# Patient Record
Sex: Female | Born: 1970
Health system: Southern US, Community
[De-identification: ages and names within clinical notes are randomized; demographics above are authoritative.]

## PROBLEM LIST (undated history)

## (undated) DIAGNOSIS — D251 Intramural leiomyoma of uterus: Secondary | ICD-10-CM

## (undated) DIAGNOSIS — N920 Excessive and frequent menstruation with regular cycle: Secondary | ICD-10-CM

## (undated) DIAGNOSIS — F32A Depression, unspecified: Secondary | ICD-10-CM

## (undated) DIAGNOSIS — D219 Benign neoplasm of connective and other soft tissue, unspecified: Secondary | ICD-10-CM

## (undated) HISTORY — DX: Benign neoplasm of connective and other soft tissue, unspecified: D21.9

## (undated) HISTORY — DX: Intramural leiomyoma of uterus: D25.1

## (undated) HISTORY — DX: Excessive and frequent menstruation with regular cycle: N92.0

## (undated) HISTORY — PX: ABDOMINAL HYSTERECTOMY: SHX81

---

## 2007-08-19 ENCOUNTER — Ambulatory Visit: Payer: Self-pay | Admitting: Physician Assistant

## 2014-04-13 ENCOUNTER — Ambulatory Visit: Payer: Self-pay | Admitting: Nurse Practitioner

## 2019-12-22 DIAGNOSIS — F331 Major depressive disorder, recurrent, moderate: Secondary | ICD-10-CM | POA: Diagnosis not present

## 2019-12-27 DIAGNOSIS — F331 Major depressive disorder, recurrent, moderate: Secondary | ICD-10-CM | POA: Diagnosis not present

## 2020-01-13 ENCOUNTER — Ambulatory Visit: Payer: Self-pay

## 2020-01-13 ENCOUNTER — Other Ambulatory Visit: Payer: Self-pay

## 2020-01-13 ENCOUNTER — Telehealth: Payer: Self-pay | Admitting: Nurse Practitioner

## 2020-01-13 DIAGNOSIS — Z20822 Contact with and (suspected) exposure to covid-19: Secondary | ICD-10-CM

## 2020-01-13 LAB — POC COVID19 BINAXNOW: SARS Coronavirus 2 Ag: NEGATIVE

## 2020-01-13 NOTE — Progress Notes (Signed)
   Subjective:    Patient ID: Jessica Woods, female    DOB: [birth year], 48 y.o.   MRN: [id number]  HPI HA and runny nose for the past 24 hours, had vaccine in March/April for COVID. Denies any other symptoms and is improving today. Denies close contacts. Here today for COVID testing.   Wednesday she was sitting in a room that was very cold, that evening she started to have a slight runny nose. She is very diligent about masking.   Review of Systems  Constitutional: Negative for fever.  HENT: Positive for congestion.   Respiratory: Negative for cough.   Neurological: Positive for headaches.       Objective:   Physical Exam        Assessment & Plan:  Telehphone visit after parking lot Rapid COVID with RN. Rapid COVID negative, risk level low, no medical indication for PCR, offered patient and she declined.      RTC as needed no further recommendations at this time.

## 2020-03-07 ENCOUNTER — Ambulatory Visit (INDEPENDENT_AMBULATORY_CARE_PROVIDER_SITE_OTHER): Payer: BC Managed Care – PPO | Admitting: Obstetrics and Gynecology

## 2020-03-07 ENCOUNTER — Other Ambulatory Visit: Payer: Self-pay

## 2020-03-07 ENCOUNTER — Encounter: Payer: Self-pay | Admitting: Obstetrics and Gynecology

## 2020-03-07 ENCOUNTER — Ambulatory Visit: Payer: BC Managed Care – PPO

## 2020-03-07 ENCOUNTER — Other Ambulatory Visit (HOSPITAL_COMMUNITY)
Admission: RE | Admit: 2020-03-07 | Discharge: 2020-03-07 | Disposition: A | Discharge status: Home / Self Care | Payer: BC Managed Care – PPO | Source: Ambulatory Visit | Attending: Obstetrics and Gynecology | Admitting: Obstetrics and Gynecology

## 2020-03-07 VITALS — BP 118/72 | Ht 61.0 in | Wt 187.4 lb

## 2020-03-07 DIAGNOSIS — Z01419 Encounter for gynecological examination (general) (routine) without abnormal findings: Secondary | ICD-10-CM | POA: Diagnosis not present

## 2020-03-07 DIAGNOSIS — Z1231 Encounter for screening mammogram for malignant neoplasm of breast: Secondary | ICD-10-CM | POA: Diagnosis not present

## 2020-03-07 DIAGNOSIS — Z0000 Encounter for general adult medical examination without abnormal findings: Secondary | ICD-10-CM | POA: Diagnosis not present

## 2020-03-07 DIAGNOSIS — Z23 Encounter for immunization: Secondary | ICD-10-CM | POA: Diagnosis not present

## 2020-03-07 DIAGNOSIS — Z124 Encounter for screening for malignant neoplasm of cervix: Secondary | ICD-10-CM | POA: Insufficient documentation

## 2020-03-07 DIAGNOSIS — N760 Acute vaginitis: Secondary | ICD-10-CM | POA: Diagnosis not present

## 2020-03-07 DIAGNOSIS — N3946 Mixed incontinence: Secondary | ICD-10-CM

## 2020-03-07 DIAGNOSIS — N920 Excessive and frequent menstruation with regular cycle: Secondary | ICD-10-CM

## 2020-03-07 DIAGNOSIS — D259 Leiomyoma of uterus, unspecified: Secondary | ICD-10-CM

## 2020-03-07 DIAGNOSIS — N889 Noninflammatory disorder of cervix uteri, unspecified: Secondary | ICD-10-CM | POA: Diagnosis not present

## 2020-03-07 DIAGNOSIS — Z13220 Encounter for screening for lipoid disorders: Secondary | ICD-10-CM

## 2020-03-07 DIAGNOSIS — Z131 Encounter for screening for diabetes mellitus: Secondary | ICD-10-CM

## 2020-03-07 DIAGNOSIS — Z1211 Encounter for screening for malignant neoplasm of colon: Secondary | ICD-10-CM | POA: Diagnosis not present

## 2020-03-07 DIAGNOSIS — B9689 Other specified bacterial agents as the cause of diseases classified elsewhere: Secondary | ICD-10-CM | POA: Diagnosis not present

## 2020-03-07 DIAGNOSIS — Z1329 Encounter for screening for other suspected endocrine disorder: Secondary | ICD-10-CM

## 2020-03-07 DIAGNOSIS — N870 Mild cervical dysplasia: Secondary | ICD-10-CM | POA: Diagnosis not present

## 2020-03-07 MED ORDER — METRONIDAZOLE 500 MG PO TABS: 500.0000 mg | ORAL_TABLET | Freq: Two times a day (BID) | ORAL | 0 refills | Status: AC

## 2020-03-07 NOTE — Progress Notes (Signed)
Pt states she's here for a Annual Exam.

## 2020-03-07 NOTE — Patient Instructions (Addendum)
[employer]stitute of [place] for Calcium and Vitamin D  Age (yr) Calcium Recommended Dietary Allowance (mg/day) Vitamin D Recommended Dietary Allowance (international units/day)  9-18 1,300 600  19-50 1,000 600  51-70 1,200 600  71 and older 1,200 800  Data from Institute of Medicine. Dietary reference intakes: calcium, vitamin D. [place], [place]: [employer]; 2011.    Exercising to Stay Healthy To become healthy and stay healthy, it is recommended that you do moderate-intensity and vigorous-intensity exercise. You can tell that you are exercising at a moderate intensity if your heart starts beating faster and you start breathing faster but can still hold a conversation. You can tell that you are exercising at a vigorous intensity if you are breathing much harder and faster and cannot hold a conversation while exercising. Exercising regularly is important. It has many health benefits, such as:  Improving overall fitness, flexibility, and endurance.  Increasing bone density.  Helping with weight control.  Decreasing body fat.  Increasing muscle strength.  Reducing stress and tension.  Improving overall health. How often should I exercise? Choose an activity that you enjoy, and set realistic goals. Your health care provider can help you make an activity plan that works for you. Exercise regularly as told by your health care provider. This may include:  Doing strength training two times a week, such as: ? Lifting weights. ? Using resistance bands. ? Push-ups. ? Sit-ups. ? Yoga.  Doing a certain intensity of exercise for a given amount of time. Choose from these options: ? A total of 150 minutes of moderate-intensity exercise every week. ? A total of 75 minutes of vigorous-intensity exercise every week. ? A mix of moderate-intensity and vigorous-intensity exercise every week. Children, pregnant women, people who have not exercised  regularly, people who are overweight, and older adults may need to talk with a health care provider about what activities are safe to do. If you have a medical condition, be sure to talk with your health care provider before you start a new exercise program. What are some exercise ideas? Moderate-intensity exercise ideas include:  Walking 1 mile (1.6 km) in about 15 minutes.  Biking.  Hiking.  Golfing.  Dancing.  Water aerobics. Vigorous-intensity exercise ideas include:  Walking 4.5 miles (7.2 km) or more in about 1 hour.  Jogging or running 5 miles (8 km) in about 1 hour.  Biking 10 miles (16.1 km) or more in about 1 hour.  Lap swimming.  Roller-skating or in-line skating.  Cross-country skiing.  Vigorous competitive sports, such as football, basketball, and soccer.  Jumping rope.  Aerobic dancing. What are some everyday activities that can help me to get exercise?  [place] work, such as: ? Pushing a Conservation officer, nature. ? Raking and bagging leaves.  Washing your car.  Pushing a stroller.  Shoveling snow.  Gardening.  Washing windows or floors. How can I be more active in my day-to-day activities?  Use stairs instead of an elevator.  Take a walk during your lunch break.  If you drive, park your car farther away from your work or school.  If you take public transportation, get off one stop early and walk the rest of the way.  Stand up or walk around during all of your indoor phone calls.  Get up, stretch, and walk around every 30 minutes throughout the day.  Enjoy exercise with a friend. Support to continue exercising will help you keep a regular routine of activity. What guidelines can I  follow while exercising?  Before you start a new exercise program, talk with your health care provider.  Do not exercise so much that you hurt yourself, feel dizzy, or get very short of breath.  Wear comfortable clothes and wear shoes with good support.  Drink plenty of  water while you exercise to prevent dehydration or heat stroke.  Work out until your breathing and your heartbeat get faster. Where to find more information  U.S. Department of Health and Human Services: BondedCompany.at  Centers for Disease Control and Prevention (CDC): http://www.wolf.info/ Summary  Exercising regularly is important. It will improve your overall fitness, flexibility, and endurance.  Regular exercise also will improve your overall health. It can help you control your weight, reduce stress, and improve your bone density.  Do not exercise so much that you hurt yourself, feel dizzy, or get very short of breath.  Before you start a new exercise program, talk with your health care provider. This information is not intended to replace advice given to you by your health care provider. Make sure you discuss any questions you have with your health care provider. Document Revised: 03/27/2017 Document Reviewed: 03/05/2017 Elsevier Patient Education  [place].   Budget-Friendly Healthy Eating There are many ways to save money at the grocery store and continue to eat healthy. You can be successful if you:  Plan meals according to your budget.  Make a grocery list and only purchase food according to your grocery list.  Prepare food yourself. What are tips for following this plan?  Reading food labels  Compare food labels between brand name foods and the store brand. Often the nutritional value is the same, but the store brand is lower cost.  Look for products that do not have added sugar, fat, or salt (sodium). These often cost the same but are healthier for you. Products may be labeled as: ? Sugar-free. ? Nonfat. ? Low-fat. ? Sodium-free. ? Low-sodium.  Look for lean ground beef labeled as at least 92% lean and 8% fat. Shopping  Buy only the items on your grocery list and go only to the areas of the store that have the items on your list.  Use coupons only for foods  and brands you normally buy. Avoid buying items you wouldn't normally buy simply because they are on sale.  Check online and in newspapers for weekly deals.  Buy healthy items from the bulk bins when available, such as herbs, spices, flour, pasta, nuts, and dried fruit.  Buy fruits and vegetables that are in season. Prices are usually lower on in-season produce.  Look at the unit price on the price tag. Use it to compare different brands and sizes to find out which item is the best deal.  Choose healthy items that are often low-cost, such as carrots, potatoes, apples, bananas, and oranges. Dried or canned beans are a low-cost protein source.  Buy in bulk and freeze extra food. Items you can buy in bulk include meats, fish, poultry, frozen fruits, and frozen vegetables.  Avoid buying "ready-to-eat" foods, such as pre-cut fruits and vegetables and pre-made salads.  If possible, shop around to discover where you can find the best prices. Consider other retailers such as dollar stores, larger [employer], local fruit and vegetable stands, and farmers markets.  Do not shop when you are hungry. If you shop while hungry, it may be hard to stick to your list and budget.  Resist impulse buying. Use your grocery list as your  official plan for the week.  Buy a variety of vegetables and fruits by purchasing fresh, frozen, and canned items.  Look at the top and bottom shelves for deals. Foods at eye level (eye level of an adult or child) are usually more expensive.  Be efficient with your time when shopping. The more time you spend at the store, the more money you are likely to spend.  To save money when choosing more expensive foods like meats and dairy: ? Choose cheaper cuts of meat, such as bone-in chicken thighs and drumsticks instead of skinless and boneless chicken. When you are ready to prepare the chicken, you can remove the skin yourself to make it healthier. ? Choose lean meats like  chicken or [place] instead of beef. ? Choose canned seafood, such as tuna, salmon, or sardines. ? Buy eggs as a low-cost source of protein. ? Buy dried beans and peas, such as lentils, split peas, or kidney beans instead of meats. Dried beans and peas are a good alternative source of protein. ? Buy the larger tubs of yogurt instead of individual-sized containers.  Choose water instead of sodas and other sweetened beverages.  Avoid buying chips, cookies, and other "junk food." These items are usually expensive and not healthy. Cooking  Make extra food and freeze the extras in meal-sized containers or in individual portions for fast meals and snacks.  Pre-cook on days when you have extra time to prepare meals in advance. You can keep these meals in the fridge or freezer and reheat for a quick meal.  When you come home from the grocery store, wash, peel, and cut fruits and vegetables so they are ready to use and eat. This will help reduce food waste. Meal planning  Do not eat out or get fast food. Prepare food at home.  Make a grocery list and make sure to bring it with you to the store. If you have a smart phone, you could use your phone to create your shopping list.  Plan meals and snacks according to a grocery list and budget you create.  Use leftovers in your meal plan for the week.  Look for recipes where you can cook once and make enough food for two meals.  Include budget-friendly meals like stews, casseroles, and stir-fry dishes.  Try some meatless meals or try "no cook" meals like salads.  Make sure that half your plate is filled with fruits or vegetables. Choose from fresh, frozen, or canned fruits and vegetables. If eating canned, remember to rinse them before eating. This will remove any excess salt added for packaging. Summary  Eating healthy on a budget is possible if you plan your meals according to your budget, purchase according to your budget and grocery list, and  prepare food yourself.  Tips for buying more food on a limited budget include buying generic brands, using coupons only for foods you normally buy, and buying healthy items from the bulk bins when available.  Tips for buying cheaper food to replace expensive food include choosing cheaper, lean cuts of meat, and buying dried beans and peas. This information is not intended to replace advice given to you by your health care provider. Make sure you discuss any questions you have with your health care provider. Document Revised: 04/15/2017 Document Reviewed: 04/15/2017 Elsevier Patient Education  2020 [place] protect organs, store calcium, anchor muscles, and support the whole body. Keeping your bones strong is important, especially as you get  older. You can take actions to help keep your bones strong and healthy. Why is keeping my bones healthy important?  Keeping your bones healthy is important because your body constantly replaces bone cells. Cells get old, and new cells take their place. As we age, we lose bone cells because the body may not be able to make enough new cells to replace the old cells. The amount of bone cells and bone tissue you have is referred to as bone mass. The higher your bone mass, the stronger your bones. The aging process leads to an overall loss of bone mass in the body, which can increase the likelihood of:  Joint pain and stiffness.  Broken bones.  A condition in which the bones become weak and brittle (osteoporosis). A large decline in bone mass occurs in older adults. In women, it occurs about the time of menopause. What actions can I take to keep my bones healthy? Good health habits are important for maintaining healthy bones. This includes eating nutritious foods and exercising regularly. To have healthy bones, you need to get enough of the right minerals and vitamins. Most nutrition experts recommend getting these nutrients from the  foods that you eat. In some cases, taking supplements may also be recommended. Doing certain types of exercise is also important for bone health. What are the nutritional recommendations for healthy bones?  Eating a well-balanced diet with plenty of calcium and vitamin D will help to protect your bones. Nutritional recommendations vary from person to person. Ask your health care provider what is healthy for you. Here are some general guidelines. Get enough calcium Calcium is the most important (essential) mineral for bone health. Most people can get enough calcium from their diet, but supplements may be recommended for people who are at risk for osteoporosis. Good sources of calcium include:  Dairy products, such as low-fat or nonfat milk, cheese, and yogurt.  Dark green leafy vegetables, such as bok choy and broccoli.  Calcium-fortified foods, such as orange juice, cereal, bread, soy beverages, and tofu products.  Nuts, such as almonds. Follow these recommended amounts for daily calcium intake:  Children, age 57-3: 700 mg.  Children, age 73-8: 1,000 mg.  Children, age 78-13: 1,300 mg.  Teens, age 57-18: 1,300 mg.  Adults, age 58-50: 1,000 mg.  Adults, age 731-70: ? Men: 1,000 mg. ? Women: 1,200 mg.  Adults, age 39 or older: 1,200 mg.  Pregnant and breastfeeding females: ? Teens: 1,300 mg. ? Adults: 1,000 mg. Get enough vitamin D Vitamin D is the most essential vitamin for bone health. It helps the body absorb calcium. Sunlight stimulates the skin to make vitamin D, so be sure to get enough sunlight. If you live in a cold climate or you do not get outside often, your health care provider may recommend that you take vitamin D supplements. Good sources of vitamin D in your diet include:  Egg yolks.  Saltwater fish.  Milk and cereal fortified with vitamin D. Follow these recommended amounts for daily vitamin D intake:  Children and teens, age 57-18: 600 international  units.  Adults, age 55 or younger: 400-800 international units.  Adults, age 52 or older: 800-1,000 international units. Get other important nutrients Other nutrients that are important for bone health include:  Phosphorus. This mineral is found in meat, poultry, dairy foods, nuts, and legumes. The recommended daily intake for adult men and adult women is 700 mg.  Magnesium. This mineral is found in seeds, nuts, dark green  vegetables, and legumes. The recommended daily intake for adult men is 400-420 mg. For adult women, it is 310-320 mg.  Vitamin K. This vitamin is found in green leafy vegetables. The recommended daily intake is 120 mg for adult men and 90 mg for adult women. What type of physical activity is best for building and maintaining healthy bones? Weight-bearing and strength-building activities are important for building and maintaining healthy bones. Weight-bearing activities cause muscles and bones to work against gravity. Strength-building activities increase the strength of the muscles that support bones. Weight-bearing and muscle-building activities include:  Walking and hiking.  Jogging and running.  Dancing.  Gym exercises.  Lifting weights.  Tennis and racquetball.  Climbing stairs.  Aerobics. Adults should get at least 30 minutes of moderate physical activity on most days. Children should get at least 60 minutes of moderate physical activity on most days. Ask your health care provider what type of exercise is best for you. How can I find out if my bone mass is low? Bone mass can be measured with an X-ray test called a bone mineral density (BMD) test. This test is recommended for all women who are age 75 or older. It may also be recommended for:  Men who are age 31 or older.  People who are at risk for osteoporosis because of: ? Having bones that break easily. ? Having a long-term disease that weakens bones, such as kidney disease or rheumatoid  arthritis. ? Having menopause earlier than normal. ? Taking medicine that weakens bones, such as steroids, thyroid hormones, or hormone treatment for breast cancer or prostate cancer. ? Smoking. ? Drinking three or more alcoholic drinks a day. If you find that you have a low bone mass, you may be able to prevent osteoporosis or further bone loss by changing your diet and lifestyle. Where can I find more information? For more information, check out the following websites:  [place]: AviationTales.fr  [employer] of Health: www.bones.SouthExposed.es  International Osteoporosis Foundation: Administrator.iofbonehealth.org Summary  The aging process leads to an overall loss of bone mass in the body, which can increase the likelihood of broken bones and osteoporosis.  Eating a well-balanced diet with plenty of calcium and vitamin D will help to protect your bones.  Weight-bearing and strength-building activities are also important for building and maintaining strong bones.  Bone mass can be measured with an X-ray test called a bone mineral density (BMD) test. This information is not intended to replace advice given to you by your health care provider. Make sure you discuss any questions you have with your health care provider. Document Revised: 05/11/2017 Document Reviewed: 05/11/2017 Elsevier Patient Education  2020 [place].   Laparoscopically Assisted Vaginal Hysterectomy A laparoscopically assisted vaginal hysterectomy (LAVH) is a surgical procedure to remove the uterus and cervix. Sometimes, the ovaries and fallopian tubes are also removed. This surgery may be done to treat problems such as:  Noncancerous growths in the uterus (uterine fibroids) that cause symptoms.  A condition that causes the lining of the uterus to grow in other areas (endometriosis).  Problems with pelvic support.  Cancer of the cervix, ovaries, uterus, or tissue that lines the uterus  (endometrium).  Excessive (dysfunctional) uterine bleeding. During an LAVH, some of the surgical removal is done through the vagina, and the rest is done through a few small incisions in the abdomen. This technique may be an option for women who are not able to have a vaginal hysterectomy. Tell a health  care provider about:  Any allergies you have.  All medicines you are taking, including vitamins, herbs, eye drops, creams, and over-the-counter medicines.  Any problems you or family members have had with anesthetic medicines.  Any blood disorders you have.  Any surgeries you have had.  Any medical conditions you have.  Whether you are pregnant or may be pregnant. What are the risks? Generally, this is a safe procedure. However, problems may occur, including:  Infection.  Bleeding.  Allergic reactions to medicines.  Damage to other structures or organs.  Difficulty breathing. What happens before the procedure? Staying hydrated Follow instructions from your health care provider about hydration, which may include:  Up to 2 hours before the procedure - you may continue to drink clear liquids, such as water, clear fruit juice, black coffee, and plain tea. Eating and drinking restrictions Follow instructions from your health care provider about eating and drinking, which may include:  8 hours before the procedure - stop eating heavy meals or foods such as meat, fried foods, or fatty foods.  6 hours before the procedure - stop eating light meals or foods, such as toast or cereal.  6 hours before the procedure - stop drinking milk or drinks that contain milk.  2 hours before the procedure - stop drinking clear liquids. Medicines  Ask your health care provider about: ? Changing or stopping your regular medicines. This is especially important if you are taking diabetes medicines or blood thinners. ? Taking over-the-counter medicines, vitamins, herbs, and supplements. ? Taking  medicines such as aspirin and ibuprofen. These medicines can thin your blood. Do not take these medicines unless your health care provider tells you to take them.  You may be asked to take a medicine to empty your colon (bowel preparation).  You may be given antibiotic medicine to help prevent infection. General instructions  Plan to have someone take you home from the hospital or clinic.  Ask your health care provider how your surgical site will be marked or identified.  You may be asked to shower with a germ-killing soap.  Do not use any products that contain nicotine or tobacco, such as cigarettes and e-cigarettes. These can delay healing after surgery. If you need help quitting, ask your health care provider. What happens during the procedure?  To lower your risk of infection: ? Your health care team will wash or sanitize their hands. ? Hair may be removed from the surgical area. ? Your skin will be washed with soap.  An IV will be inserted into one of your veins.  You will be given one or more of the following: ? A medicine to help you relax (sedative). ? A medicine to make you fall asleep (general anesthetic).  You may have a flexible tube (catheter) put into your bladder to drain urine.  You may have a tube put through your nose or mouth down into your stomach (nasogastric tube). The nasogastric tube will remove digestive fluids and prevent nausea and vomiting.  Tight-fitting (compression) stockings will be placed on your legs to promote circulation.  Three or four small incisions will be made in your abdomen. An incision will also be made in your vagina.  Probes and tools will be inserted into the small incisions. The uterus and cervix (and possibly the ovaries and fallopian tubes) will be removed through your vagina as well as through the small incisions that were made in the abdomen.  The incisions will then be closed with stitches (sutures).  The procedure may vary  among health care providers and hospitals. What happens after the procedure?  Your blood pressure, heart rate, breathing rate, and blood oxygen level will be monitored until the medicines you were given have worn off.  You may have a liquid diet at first. You will most likely return to your usual diet the day after surgery.  You will still have the urinary catheter in place. It will likely be removed the day after surgery.  You may have to wear compression stockings. These stockings help to prevent blood clots and reduce swelling in your legs.  You will be encouraged to walk as soon as possible. You will also use a device or do breathing exercises to keep your lungs clear.  Do not drive for 24 hours if you were given a sedative. Summary  A laparoscopically assisted vaginal hysterectomy (LAVH) is a surgical procedure to remove the uterus and cervix, and sometimes the ovaries and fallopian tubes.  Follow instructions from your health care provider about eating and drinking before the procedure.  During an LAVH, some of the surgical removal is done through the vagina, and the rest is done through a few small incisions in the abdomen. This information is not intended to replace advice given to you by your health care provider. Make sure you discuss any questions you have with your health care provider. Document Revised: 06/07/2018 Document Reviewed: 07/10/2016 Elsevier Patient Education  [place].  Uterine Fibroids  Uterine fibroids (leiomyomas) are noncancerous (benign) tumors that can develop in the uterus. Fibroids may also develop in the fallopian tubes, cervix, or tissues (ligaments) near the uterus. You may have one or many fibroids. Fibroids vary in size, weight, and where they grow in the uterus. Some can become quite large. Most fibroids do not require medical treatment. What are the causes? The cause of this condition is not known. What increases the risk? You are more  likely to develop this condition if you:  Are in your 30s or 40s and have not gone through menopause.  Have a family history of this condition.  Are of African-American descent.  Had your first period at an early age (early menarche).  Have not had any children (nulliparity).  Are overweight or obese. What are the signs or symptoms? Many women do not have any symptoms. Symptoms of this condition may include:  Heavy menstrual bleeding.  Bleeding or spotting between periods.  Pain and pressure in the pelvic area, between the hips.  Bladder problems, such as needing to urinate urgently or more often than usual.  Inability to have children (infertility).  Failure to carry pregnancy to term (miscarriage). How is this diagnosed? This condition may be diagnosed based on:  Your symptoms and medical history.  A physical exam.  A pelvic exam that includes feeling for any tumors.  Imaging tests, such as ultrasound or MRI. How is this treated? Treatment for this condition may include:  Seeing your health care provider for follow-up visits to monitor your fibroids for any changes.  Taking NSAIDs such as ibuprofen, naproxen, or aspirin to reduce pain.  Hormone medicines. These may be taken as a pill, given in an injection, or delivered by a T-shaped device that is inserted into the uterus (intrauterine device, IUD).  Surgery to remove one of the following: ? The fibroids (myomectomy). Your health care provider may recommend this if fibroids affect your fertility and you want to become pregnant. ? The uterus (hysterectomy). ? Blood supply  to the fibroids (uterine artery embolization). Follow these instructions at home:  Take over-the-counter and prescription medicines only as told by your health care provider.  Ask your health care provider if you should take iron pills or eat more iron-rich foods, such as dark green, leafy vegetables. Heavy menstrual bleeding can cause low  iron levels.  If directed, apply heat to your back or abdomen to reduce pain. Use the heat source that your health care provider recommends, such as a moist heat pack or a heating pad. ? Place a towel between your skin and the heat source. ? Leave the heat on for 20-30 minutes. ? Remove the heat if your skin turns bright red. This is especially important if you are unable to feel pain, heat, or cold. You may have a greater risk of getting burned.  Pay close attention to your menstrual cycle. Tell your health care provider about any changes, such as: ? Increased blood flow that requires you to use more pads or tampons than usual. ? A change in the number of days that your period lasts. ? A change in symptoms that are associated with your period, such as back pain or cramps in your abdomen.  Keep all follow-up visits as told by your health care provider. This is important, especially if your fibroids need to be monitored for any changes. Contact a health care provider if you:  Have pelvic pain, back pain, or cramps in your abdomen that do not get better with medicine or heat.  Develop new bleeding between periods.  Have increased bleeding during or between periods.  Feel unusually tired or weak.  Feel light-headed. Get help right away if you:  Faint.  Have pelvic pain that suddenly gets worse.  Have severe vaginal bleeding that soaks a tampon or pad in 30 minutes or less. Summary  Uterine fibroids are noncancerous (benign) tumors that can develop in the uterus.  The exact cause of this condition is not known.  Most fibroids do not require medical treatment unless they affect your ability to have children (fertility).  Contact a health care provider if you have pelvic pain, back pain, or cramps in your abdomen that do not get better with medicines.  Make sure you know what symptoms should cause you to get help right away. This information is not intended to replace advice given  to you by your health care provider. Make sure you discuss any questions you have with your health care provider. Document Revised: 03/27/2017 Document Reviewed: 03/10/2017 Elsevier Patient Education  2020 [employer].

## 2020-03-07 NOTE — Addendum Note (Signed)
Addended by: Vernia Buff A on: 03/07/2020 03:38 PM   Modules accepted: Orders

## 2020-03-07 NOTE — Progress Notes (Signed)
Gynecology Annual Exam  PCP: Patient, No Pcp Per  Chief Complaint:  Chief Complaint  Patient presents with  . Gynecologic Exam    History of Present Illness: Patient is a 49 y.o. No obstetric history on file. presents for annual exam. The patient has no complaints today.   LMP: Patient's last menstrual period was 02/16/2020. Average Interval: regular, monthly Duration of flow: 7 days Heavy Menses: yes Intermenstrual Bleeding: no Dysmenorrhea: no    The patient is not currently sexually active. She denies dyspareunia.  Postcoital Bleeding: no She currently uses abstinence for contraception.    The patient does perform self breast exams.  There is no notable family history of breast or ovarian cancer in her family.  The patient denies current symptoms of depression.   PHQ-9: 5  Review of Systems: Review of Systems  Constitutional: Negative for chills, fever, malaise/fatigue and weight loss.  HENT: Negative for congestion, hearing loss and sinus pain.   Eyes: Negative for blurred vision and double vision.  Respiratory: Negative for cough, sputum production, shortness of breath and wheezing.   Cardiovascular: Negative for chest pain, palpitations, orthopnea and leg swelling.  Gastrointestinal: Negative for abdominal pain, constipation, diarrhea, nausea and vomiting.  Genitourinary: Negative for dysuria, flank pain, frequency, hematuria and urgency.  Musculoskeletal: Negative for back pain, falls and joint pain.  Skin: Negative for itching and rash.  Neurological: Negative for dizziness and headaches.  Psychiatric/Behavioral: Negative for depression, substance abuse and suicidal ideas. The patient is not nervous/anxious.     Past Medical History:  Past Medical History:  Diagnosis Date  . Fibroid     Past Surgical History:  History reviewed. No pertinent surgical history.  Gynecologic History:  Patient's last menstrual period was 02/16/2020. Contraception:  abstinence Last Pap: Results were: unknown Last mammogram: before 2018, result unknown Obstetric History: No obstetric history on file.  Family History:  Family History  Problem Relation Age of Onset  . Heart failure Mother   . Hyperlipidemia Mother   . Heart failure Father     Social History:  Social History   Socioeconomic History  . Marital status: Married    Spouse name: Not on file  . Number of children: Not on file  . Years of education: Not on file  . Highest education level: Not on file  Occupational History  . Not on file  Tobacco Use  . Smoking status: Never Smoker  . Smokeless tobacco: Never Used  Vaping Use  . Vaping Use: Never used  Substance and Sexual Activity  . Alcohol use: Never  . Drug use: Never  . Sexual activity: Not Currently  Other Topics Concern  . Not on file  Social History Narrative  . Not on file   Social Determinants of Health   Financial Resource Strain:   . Difficulty of Paying Living Expenses: Not on file  Food Insecurity:   . Worried About Charity fundraiser in the Last Year: Not on file  . Ran Out of Food in the Last Year: Not on file  Transportation Needs:   . Lack of Transportation (Medical): Not on file  . Lack of Transportation (Non-Medical): Not on file  Physical Activity:   . Days of Exercise per Week: Not on file  . Minutes of Exercise per Session: Not on file  Stress:   . Feeling of Stress : Not on file  Social Connections:   . Frequency of Communication with Friends and Family: Not on file  .  Frequency of Social Gatherings with Friends and Family: Not on file  . Attends Religious Services: Not on file  . Active Member of Clubs or Organizations: Not on file  . Attends Archivist Meetings: Not on file  . Marital Status: Not on file  Intimate Partner Violence:   . Fear of Current or Ex-Partner: Not on file  . Emotionally Abused: Not on file  . Physically Abused: Not on file  . Sexually Abused: Not on  file    Allergies:  No Known Allergies  Medications: Prior to Admission medications   Not on File    Physical Exam Vitals: Blood pressure 118/72, height 5\' 1"  (1.549 m), weight 187 lb 6.4 oz (85 kg), last menstrual period 02/16/2020.  Physical Exam Constitutional:      Appearance: She is well-developed.  Genitourinary:     Vagina and uterus normal.     No lesions in the vagina.     No cervical motion tenderness.     No right or left adnexal mass present.     Genitourinary Comments: External: Vulva normal . No lesions noted.  Speculum examination:  Cervix had a lesion at 6 o;clcok, pearly white with abnormal vasculature. Biopsy performed.  . No blood in the vaginal vault. Scant discharge.   Bimanual examination: Uterus enlarged, irregular contour, above the umbilicus.   No CMT. Adnexa normal. Pelvis full with enlarged uterus, mobile. Large fundal fibroid, right lower segment fibroid. Left broad ligament fibroid.   Normal breast exam. No lesions.   HENT:     Head: Normocephalic and atraumatic.  Neck:     Thyroid: No thyromegaly.  Cardiovascular:     Rate and Rhythm: Normal rate and regular rhythm.     Heart sounds: Normal heart sounds.  Pulmonary:     Effort: Pulmonary effort is normal.     Breath sounds: Normal breath sounds.  Chest:     Breasts:        Right: No inverted nipple, mass, nipple discharge or skin change.        Left: No inverted nipple, mass, nipple discharge or skin change.  Abdominal:     General: Bowel sounds are normal. There is no distension.     Palpations: Abdomen is soft. There is no mass.  Musculoskeletal:     Cervical back: Neck supple.  Neurological:     Mental Status: She is alert and oriented to person, place, and time.  Skin:    General: Skin is warm and dry.  Psychiatric:        Behavior: Behavior normal.        Thought Content: Thought content normal.        Judgment: Judgment normal.  Vitals reviewed. Exam conducted with a chaperone  present.    Procedure: Cervical lesion was biopsied using the colposcopy tool.  Monsel's solution was applied afterwards for hemostasis.   Female chaperone present for pelvic and breast  portions of the physical exam  Assessment: 49 y.o. No obstetric history on file. routine annual exam  Plan: Problem List Items Addressed This Visit    None    Visit Diagnoses    Encounter for annual routine gynecological examination    -  Primary   Health maintenance examination       Breast cancer screening by mammogram       Relevant Orders   MM 3D SCREEN BREAST BILATERAL   Colon cancer screening       Relevant Orders  Ambulatory referral to Gastroenterology   Cervical cancer screening       Relevant Orders   Cytology - PAP   Screening cholesterol level       Relevant Orders   Lipid panel   Screening for diabetes mellitus       Relevant Orders   Comprehensive metabolic panel   Screening for thyroid disorder       Relevant Orders   TSH   T4, free   Encounter for gynecological examination without abnormal finding       Need for immunization against influenza       Relevant Orders   Flu Vaccine QUAD 36+ mos IM (Completed)   Menorrhagia with regular cycle       Relevant Orders   CBC   Mixed incontinence urge and stress (female)(female)       Relevant Orders   Ambulatory referral to Urology   Uterine leiomyoma, unspecified location       Relevant Orders   US PELVIS TRANSVAGINAL NON-OB (TV ONLY)   Abnormality of cervix       Relevant Orders   Surgical pathology   Bacterial vaginosis       Relevant Medications   metroNIDAZOLE (FLAGYL) 500 MG tablet   Other Relevant Orders   NuSwab BV and Candida, NAA      1) Mammogram - recommend yearly screening mammogram.  Mammogram Was ordered today  2) STI screening was offered and accepted  3) ASCCP guidelines and rational discussed.  Patient opts for every 3 years screening interval  4) Contraception -abstinence  5) Colonoscopy --  referral placed.   6) Routine healthcare maintenance including cholesterol, diabetes screening discussed To return fasting at a later date  7) Enlarged 20 cm fibroid uterus- discussed options for management. Patient desires a hysterectomy.   8) Mixed urinary incontinence. Bulk of uterus may be contributing. Will refer to urology.   9) Cervical growth- colposcopic biopsy at the 6 o'clock position.   Adrian Prows MD, [facility] 03/07/2020 3:05 PM

## 2020-03-08 DIAGNOSIS — R87612 Low grade squamous intraepithelial lesion on cytologic smear of cervix (LGSIL): Secondary | ICD-10-CM | POA: Diagnosis not present

## 2020-03-09 LAB — NUSWAB BV AND CANDIDA, NAA
Atopobium vaginae: HIGH Score — AB
BVAB 2: HIGH Score — AB
Candida albicans, NAA: NEGATIVE
Candida glabrata, NAA: NEGATIVE
Megasphaera 1: HIGH Score — AB

## 2020-03-09 LAB — SURGICAL PATHOLOGY

## 2020-03-10 LAB — CYTOLOGY - PAP
Comment: NEGATIVE
High risk HPV: NEGATIVE

## 2020-03-14 ENCOUNTER — Other Ambulatory Visit: Payer: Self-pay

## 2020-03-14 DIAGNOSIS — Z1329 Encounter for screening for other suspected endocrine disorder: Secondary | ICD-10-CM

## 2020-03-14 DIAGNOSIS — N920 Excessive and frequent menstruation with regular cycle: Secondary | ICD-10-CM

## 2020-03-15 ENCOUNTER — Other Ambulatory Visit: Payer: BC Managed Care – PPO

## 2020-03-15 ENCOUNTER — Other Ambulatory Visit: Payer: Self-pay

## 2020-03-15 DIAGNOSIS — N920 Excessive and frequent menstruation with regular cycle: Secondary | ICD-10-CM

## 2020-03-15 DIAGNOSIS — Z1329 Encounter for screening for other suspected endocrine disorder: Secondary | ICD-10-CM

## 2020-03-16 ENCOUNTER — Encounter: Payer: Self-pay | Admitting: *Deleted

## 2020-03-16 LAB — COMPREHENSIVE METABOLIC PANEL
ALT: 9 IU/L (ref 0–32)
AST: 16 IU/L (ref 0–40)
Albumin/Globulin Ratio: 2.1 (ref 1.2–2.2)
Albumin: 4.2 g/dL (ref 3.8–4.8)
Alkaline Phosphatase: 53 IU/L (ref 44–121)
BUN/Creatinine Ratio: 12 (ref 9–23)
BUN: 13 mg/dL (ref 6–24)
Bilirubin Total: 0.2 mg/dL (ref 0.0–1.2)
CO2: 25 mmol/L (ref 20–29)
Calcium: 9.4 mg/dL (ref 8.7–10.2)
Chloride: 103 mmol/L (ref 96–106)
Creatinine, Ser: 1.1 mg/dL — ABNORMAL HIGH (ref 0.57–1.00)
GFR calc Af Amer: 69 mL/min/{1.73_m2} (ref >59)
GFR calc non Af Amer: 60 mL/min/{1.73_m2} (ref >59)
Globulin, Total: 2 g/dL (ref 1.5–4.5)
Glucose: 95 mg/dL (ref 65–99)
Potassium: 4.8 mmol/L (ref 3.5–5.2)
Sodium: 141 mmol/L (ref 134–144)
Total Protein: 6.2 g/dL (ref 6.0–8.5)

## 2020-03-16 LAB — CBC WITH DIFFERENTIAL/PLATELET
Basophils Absolute: 0 10*3/uL (ref 0.0–0.2)
Basos: 1 %
EOS (ABSOLUTE): 0.1 10*3/uL (ref 0.0–0.4)
Eos: 2 %
Hematocrit: 35.9 % (ref 34.0–46.6)
Hemoglobin: 11.1 g/dL (ref 11.1–15.9)
Immature Grans (Abs): 0 10*3/uL (ref 0.0–0.1)
Immature Granulocytes: 1 %
Lymphocytes Absolute: 1.1 10*3/uL (ref 0.7–3.1)
Lymphs: 20 %
MCH: 25.8 pg — ABNORMAL LOW (ref 26.6–33.0)
MCHC: 30.9 g/dL — ABNORMAL LOW (ref 31.5–35.7)
MCV: 84 fL (ref 79–97)
Monocytes Absolute: 0.5 10*3/uL (ref 0.1–0.9)
Monocytes: 8 %
Neutrophils Absolute: 3.8 10*3/uL (ref 1.4–7.0)
Neutrophils: 68 %
Platelets: 254 10*3/uL (ref 150–450)
RBC: 4.3 x10E6/uL (ref 3.77–5.28)
RDW: 17.7 % — ABNORMAL HIGH (ref 11.7–15.4)
WBC: 5.5 10*3/uL (ref 3.4–10.8)

## 2020-03-16 LAB — LIPID PANEL
Chol/HDL Ratio: 2.4 ratio (ref 0.0–4.4)
Cholesterol, Total: 152 mg/dL (ref 100–199)
HDL: 63 mg/dL (ref >39)
LDL Chol Calc (NIH): 74 mg/dL (ref 0–99)
Triglycerides: 81 mg/dL (ref 0–149)
VLDL Cholesterol Cal: 15 mg/dL (ref 5–40)

## 2020-03-16 LAB — T4, FREE: Free T4: 1.11 ng/dL (ref 0.82–1.77)

## 2020-03-16 LAB — TSH: TSH: 3.83 u[IU]/mL (ref 0.450–4.500)

## 2020-03-20 ENCOUNTER — Other Ambulatory Visit: Payer: Self-pay

## 2020-03-20 ENCOUNTER — Ambulatory Visit (INDEPENDENT_AMBULATORY_CARE_PROVIDER_SITE_OTHER): Payer: BC Managed Care – PPO | Admitting: Obstetrics and Gynecology

## 2020-03-20 ENCOUNTER — Encounter: Payer: Self-pay | Admitting: Obstetrics and Gynecology

## 2020-03-20 VITALS — BP 122/72 | Ht 62.0 in | Wt 194.6 lb

## 2020-03-20 DIAGNOSIS — N920 Excessive and frequent menstruation with regular cycle: Secondary | ICD-10-CM | POA: Diagnosis not present

## 2020-03-20 NOTE — Patient Instructions (Signed)
Laparoscopically Assisted Vaginal Hysterectomy A laparoscopically assisted vaginal hysterectomy (LAVH) is a surgical procedure to remove the uterus and cervix. Sometimes, the ovaries and fallopian tubes are also removed. This surgery may be done to treat problems such as:  Noncancerous growths in the uterus (uterine fibroids) that cause symptoms.  A condition that causes the lining of the uterus to grow in other areas (endometriosis).  Problems with pelvic support.  Cancer of the cervix, ovaries, uterus, or tissue that lines the uterus (endometrium).  Excessive (dysfunctional) uterine bleeding. During an LAVH, some of the surgical removal is done through the vagina, and the rest is done through a few small incisions in the abdomen. This technique may be an option for women who are not able to have a vaginal hysterectomy. Tell a health care provider about:  Any allergies you have.  All medicines you are taking, including vitamins, herbs, eye drops, creams, and over-the-counter medicines.  Any problems you or family members have had with anesthetic medicines.  Any blood disorders you have.  Any surgeries you have had.  Any medical conditions you have.  Whether you are pregnant or may be pregnant. What are the risks? Generally, this is a safe procedure. However, problems may occur, including:  Infection.  Bleeding.  Allergic reactions to medicines.  Damage to other structures or organs.  Difficulty breathing. What happens before the procedure? Staying hydrated Follow instructions from your health care provider about hydration, which may include:  Up to 2 hours before the procedure - you may continue to drink clear liquids, such as water, clear fruit juice, black coffee, and plain tea. Eating and drinking restrictions Follow instructions from your health care provider about eating and drinking, which may include:  8 hours before the procedure - stop eating heavy meals or  foods such as meat, fried foods, or fatty foods.  6 hours before the procedure - stop eating light meals or foods, such as toast or cereal.  6 hours before the procedure - stop drinking milk or drinks that contain milk.  2 hours before the procedure - stop drinking clear liquids. Medicines  Ask your health care provider about: ? Changing or stopping your regular medicines. This is especially important if you are taking diabetes medicines or blood thinners. ? Taking over-the-counter medicines, vitamins, herbs, and supplements. ? Taking medicines such as aspirin and ibuprofen. These medicines can thin your blood. Do not take these medicines unless your health care provider tells you to take them.  You may be asked to take a medicine to empty your colon (bowel preparation).  You may be given antibiotic medicine to help prevent infection. General instructions  Plan to have someone take you home from the hospital or clinic.  Ask your health care provider how your surgical site will be marked or identified.  You may be asked to shower with a germ-killing soap.  Do not use any products that contain nicotine or tobacco, such as cigarettes and e-cigarettes. These can delay healing after surgery. If you need help quitting, ask your health care provider. What happens during the procedure?  To lower your risk of infection: ? Your health care team will wash or sanitize their hands. ? Hair may be removed from the surgical area. ? Your skin will be washed with soap.  An IV will be inserted into one of your veins.  You will be given one or more of the following: ? A medicine to help you relax (sedative). ? A medicine to  make you fall asleep (general anesthetic).  You may have a flexible tube (catheter) put into your bladder to drain urine.  You may have a tube put through your nose or mouth down into your stomach (nasogastric tube). The nasogastric tube will remove digestive fluids and  prevent nausea and vomiting.  Tight-fitting (compression) stockings will be placed on your legs to promote circulation.  Three or four small incisions will be made in your abdomen. An incision will also be made in your vagina.  Probes and tools will be inserted into the small incisions. The uterus and cervix (and possibly the ovaries and fallopian tubes) will be removed through your vagina as well as through the small incisions that were made in the abdomen.  The incisions will then be closed with stitches (sutures). The procedure may vary among health care providers and hospitals. What happens after the procedure?  Your blood pressure, heart rate, breathing rate, and blood oxygen level will be monitored until the medicines you were given have worn off.  You may have a liquid diet at first. You will most likely return to your usual diet the day after surgery.  You will still have the urinary catheter in place. It will likely be removed the day after surgery.  You may have to wear compression stockings. These stockings help to prevent blood clots and reduce swelling in your legs.  You will be encouraged to walk as soon as possible. You will also use a device or do breathing exercises to keep your lungs clear.  Do not drive for 24 hours if you were given a sedative. Summary  A laparoscopically assisted vaginal hysterectomy (LAVH) is a surgical procedure to remove the uterus and cervix, and sometimes the ovaries and fallopian tubes.  Follow instructions from your health care provider about eating and drinking before the procedure.  During an LAVH, some of the surgical removal is done through the vagina, and the rest is done through a few small incisions in the abdomen. This information is not intended to replace advice given to you by your health care provider. Make sure you discuss any questions you have with your health care provider. Document Revised: 06/07/2018 Document Reviewed:  07/10/2016 Elsevier Patient Education  [place].

## 2020-03-20 NOTE — Progress Notes (Signed)
Patient ID: Jessica Woods, female   DOB: February 16, 1971, 49 y.o.   MRN: [id number]  Reason for Consult: Gynecologic Exam (FOLLOW UP ON HER PAP)   Referred by No ref. provider found  Subjective:     HPI:  Jessica Woods is a 49 y.o. female shunt is following up from her visit last week.  Her laboratory evaluation was normal.  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  Her pelvic ultrasound is still pending. [place] tech is out of office sick today.    Past Medical History:  Diagnosis Date  . Fibroid    Family History  Problem Relation Age of Onset  . Heart failure Mother   . Hyperlipidemia Mother   . Heart failure Father    No past surgical history on file.  Short Social History:  Social History   Tobacco Use  . Smoking status: Never Smoker  . Smokeless tobacco: Never Used  Substance Use Topics  . Alcohol use: Never    No Known Allergies  No current outpatient medications on file.   No current facility-administered medications for this visit.    Review of Systems  Constitutional: Negative for chills, fatigue, fever and unexpected weight change.  HENT: Negative for trouble swallowing.  Eyes: Negative for loss of vision.  Respiratory: Negative for cough, shortness of breath and wheezing.  Cardiovascular: Negative for chest pain, leg swelling, palpitations and syncope.  GI: Negative for abdominal pain, blood in stool, diarrhea, nausea and vomiting.  GU: Negative for difficulty urinating, dysuria, frequency and hematuria.  Musculoskeletal: Negative for back pain, leg pain and joint pain.  Skin: Negative for rash.  Neurological: Negative for dizziness, headaches, light-headedness, numbness and seizures.  Psychiatric: Negative for behavioral problem, confusion, depressed mood and sleep disturbance.        Objective:  Objective   Vitals:   03/20/20 1455  BP: 122/72  Weight: 194 lb 9.6 oz  (88.3 kg)  Height: 5\' 2"  (1.575 m)   Body mass index is 35.59 kg/m.  Physical Exam Vitals and nursing note reviewed.  Constitutional:      Appearance: She is well-developed.  HENT:     Head: Normocephalic and atraumatic.  Eyes:     Pupils: Pupils are equal, round, and reactive to light.  Cardiovascular:     Rate and Rhythm: Normal rate and regular rhythm.  Pulmonary:     Effort: Pulmonary effort is normal. No respiratory distress.  Skin:    General: Skin is warm and dry.  Neurological:     Mental Status: She is alert and oriented to person, place, and time.  Psychiatric:        Behavior: Behavior normal.        Thought Content: Thought content normal.        Judgment: Judgment normal.     Assessment/Plan:     49 year old with enlarged bulky uterus likely secondary to uterine fibroids We discussed options for management of uterine fibroids including hysterectomy.  Patient is most interested in this time and hysterectomy. Will return for [place] and EMB  More than 20 minutes were spent face to face with the patient in the room, reviewing the medical record, labs and images, and coordinating care for the patient. The plan of management was discussed in detail and counseling was provided.      Adrian Prows MD [facility] 03/20/2020 3:26 PM

## 2020-04-04 ENCOUNTER — Other Ambulatory Visit: Payer: Self-pay | Admitting: Obstetrics and Gynecology

## 2020-04-04 ENCOUNTER — Other Ambulatory Visit (HOSPITAL_COMMUNITY)
Admission: RE | Admit: 2020-04-04 | Discharge: 2020-04-04 | Disposition: A | Discharge status: Home / Self Care | Payer: BC Managed Care – PPO | Source: Ambulatory Visit | Attending: Obstetrics and Gynecology | Admitting: Obstetrics and Gynecology

## 2020-04-04 ENCOUNTER — Encounter: Payer: Self-pay | Admitting: Obstetrics and Gynecology

## 2020-04-04 ENCOUNTER — Ambulatory Visit (INDEPENDENT_AMBULATORY_CARE_PROVIDER_SITE_OTHER): Payer: BC Managed Care – PPO

## 2020-04-04 ENCOUNTER — Ambulatory Visit (INDEPENDENT_AMBULATORY_CARE_PROVIDER_SITE_OTHER): Payer: BC Managed Care – PPO | Admitting: Obstetrics and Gynecology

## 2020-04-04 ENCOUNTER — Other Ambulatory Visit: Payer: Self-pay

## 2020-04-04 VITALS — BP 146/80 | Ht 62.0 in | Wt 194.6 lb

## 2020-04-04 DIAGNOSIS — D259 Leiomyoma of uterus, unspecified: Secondary | ICD-10-CM

## 2020-04-04 DIAGNOSIS — D250 Submucous leiomyoma of uterus: Secondary | ICD-10-CM | POA: Insufficient documentation

## 2020-04-04 DIAGNOSIS — D252 Subserosal leiomyoma of uterus: Secondary | ICD-10-CM | POA: Diagnosis not present

## 2020-04-04 DIAGNOSIS — D251 Intramural leiomyoma of uterus: Secondary | ICD-10-CM | POA: Diagnosis not present

## 2020-04-04 DIAGNOSIS — N920 Excessive and frequent menstruation with regular cycle: Secondary | ICD-10-CM | POA: Diagnosis not present

## 2020-04-04 NOTE — Progress Notes (Signed)
GYN visit with [place] and EMB ok to over book

## 2020-04-04 NOTE — Progress Notes (Signed)
  Endometrial Biopsy After discussion with the patient regarding her abnormal uterine bleeding I recommended that she proceed with an endometrial biopsy for further diagnosis. The risks, benefits, alternatives, and indications for an endometrial biopsy were discussed with the patient in detail. She understood the risks including infection, bleeding, cervical laceration and uterine perforation.  Verbal consent was obtained.   PROCEDURE NOTE:  Pipelle endometrial biopsy was performed using aseptic technique with iodine preparation.  The uterus was sounded to a length of 10 cm.  Adequate sampling was obtained with minimal blood loss.  The patient tolerated the procedure well.  Disposition will be pending pathology.  Adrian Prows MD, [facility] 04/04/2020 9:14 AM

## 2020-04-04 NOTE — Patient Instructions (Signed)
Total Laparoscopic Hysterectomy A total laparoscopic hysterectomy is a minimally invasive surgery to remove the uterus and cervix. The fallopian tubes and ovaries can also be removed (bilateral salpingo-oophorectomy) during this surgery, if necessary. This procedure may be done to treat problems such as:  Noncancerous growths in the uterus (uterine fibroids) that cause symptoms.  A condition that causes the lining of the uterus (endometrium) to grow in other areas (endometriosis).  Problems with pelvic support. This is caused by weakened muscles of the pelvis following vaginal childbirth or menopause.  Cancer of the cervix, ovaries, uterus, or endometrium.  Excessive (dysfunctional) uterine bleeding. This surgery is performed by inserting a thin, lighted tube (laparoscope) and surgical instruments into small incisions in the abdomen. The laparoscope sends images to a monitor. The images help the health care provider perform the procedure. After this procedure, you will no longer be able to have a baby, and you will no longer have a menstrual period. Tell a health care provider about:  Any allergies you have.  All medicines you are taking, including vitamins, herbs, eye drops, creams, and over-the-counter medicines.  Any problems you or family members have had with anesthetic medicines.  Any blood disorders you have.  Any surgeries you have had.  Any medical conditions you have.  Whether you are pregnant or may be pregnant. What are the risks? Generally, this is a safe procedure. However, problems may occur, including:  Infection.  Bleeding.  Blood clots in the legs or lungs.  Allergic reactions to medicines.  Damage to other structures or organs.  The risk that the surgery may have to be switched to the regular one in which a large incision is made in the abdomen (abdominal hysterectomy). What happens before the procedure? Staying hydrated Follow instructions from your  health care provider about hydration, which may include:  Up to 2 hours before the procedure - you may continue to drink clear liquids, such as water, clear fruit juice, black coffee, and plain tea Eating and drinking restrictions Follow instructions from your health care provider about eating and drinking, which may include:  8 hours before the procedure - stop eating heavy meals or foods such as meat, fried foods, or fatty foods.  6 hours before the procedure - stop eating light meals or foods, such as toast or cereal.  6 hours before the procedure - stop drinking milk or drinks that contain milk.  2 hours before the procedure - stop drinking clear liquids. Medicines  Ask your health care provider about: ? Changing or stopping your regular medicines. This is especially important if you are taking diabetes medicines or blood thinners. ? Taking over-the-counter medicines, vitamins, herbs, and supplements. ? Taking medicines such as aspirin and ibuprofen. These medicines can thin your blood. Do not take these medicines unless your health care provider tells you to take them.  You may be given antibiotic medicine to help prevent infection.  You may be asked to take laxatives.  You may be given medicines to help prevent nausea and vomiting after the procedure. General instructions  Ask your health care provider how your surgical site will be marked or identified.  You may be asked to shower with a germ-killing soap.  Do not use any products that contain nicotine or tobacco, such as cigarettes and e-cigarettes. If you need help quitting, ask your health care provider.  You may have an exam or testing, such as an ultrasound to determine the size and shape of your pelvic organs.    You may have a blood or urine sample taken.  This procedure can affect the way you feel about yourself. Talk with your health care provider about the physical and emotional changes hysterectomy may  cause.  Plan to have someone take you home from the hospital or clinic.  Plan to have a responsible adult care for you for at least 24 hours after you leave the hospital or clinic. This is important. What happens during the procedure?  To lower your risk of infection: ? Your health care team will wash or sanitize their hands. ? Your skin will be washed with soap. ? Hair may be removed from the surgical area.  An IV will be inserted into one of your veins.  You will be given one or more of the following: ? A medicine to help you relax (sedative). ? A medicine to make you fall asleep (general anesthetic).  You will be given antibiotic medicine through your IV.  A tube may be inserted down your throat to help you breathe during the procedure.  A gas (carbon dioxide) will be used to inflate your abdomen to allow your surgeon to see inside of your abdomen.  Three or four small incisions will be made in your abdomen.  A laparoscope will be inserted into one of your incisions. Surgical instruments will be inserted through the other incisions in order to perform the procedure.  Your uterus and cervix may be removed through your vagina or cut into small pieces and removed through the small incisions. Any other organs that need to be removed will also be removed this way.  Carbon dioxide will be released from inside of your abdomen.  Your incisions will be closed with stitches (sutures).  A bandage (dressing) may be placed over your incisions. The procedure may vary among health care providers and hospitals. What happens after the procedure?  Your blood pressure, heart rate, breathing rate, and blood oxygen level will be monitored until the medicines you were given have worn off.  You will be given medicine for pain and nausea as needed.  Do not drive for 24 hours if you received a sedative. Summary  Total Laparoscopic hysterectomy is a procedure to remove your uterus, cervix and  sometimes the fallopian tubes and ovaries.  This procedure can affect the way you feel about yourself. Talk with your health care provider about the physical and emotional changes hysterectomy may cause.  After this procedure, you will no longer be able to have a baby, and you will no longer have a menstrual period.  You will be given pain medicine to control discomfort after this procedure. This information is not intended to replace advice given to you by your health care provider. Make sure you discuss any questions you have with your health care provider. Document Revised: 03/27/2017 Document Reviewed: 06/25/2016 Elsevier Patient Education  2020 [employer].

## 2020-04-05 LAB — SURGICAL PATHOLOGY

## 2020-04-24 ENCOUNTER — Telehealth: Payer: Self-pay | Admitting: Obstetrics and Gynecology

## 2020-04-24 NOTE — Telephone Encounter (Signed)
Left a message for the patient to return the call.  

## 2020-04-25 DIAGNOSIS — Z03818 Encounter for observation for suspected exposure to other biological agents ruled out: Secondary | ICD-10-CM | POA: Diagnosis not present

## 2020-04-25 DIAGNOSIS — Z20822 Contact with and (suspected) exposure to covid-19: Secondary | ICD-10-CM | POA: Diagnosis not present

## 2020-05-02 NOTE — Telephone Encounter (Signed)
Patient is calling back to scheduled her surgery. Please advise  [phone]

## 2020-05-08 ENCOUNTER — Telehealth: Payer: Self-pay | Admitting: Obstetrics and Gynecology

## 2020-05-08 NOTE — Telephone Encounter (Signed)
Called patient to schedule robotic assisted total hysterectomy, bilateral salpingectomy, cystoscopy with ureteral stent placement  DOS 05/29/20  H&P 1/21 @ 8:10   Covid testing 1/28 @ 8-10:30, [address], drive up and wear mask. Advised pt to quarantine until DOS.  Pre-admit phone call appointment to be requested - date and time will be included on H&P paper work. Also all appointments will be updated on pt MyChart. Explained that this appointment has a call window. Based on the time scheduled will indicate if the call will be received within a 4 hour window before 1:00 or after.  Advised that pt may also receive calls from the hospital pharmacy and pre-service center.  Confirmed pt has [employer] as Chartered certified accountant. No secondary insurance.  I have copied Amy w urology as requested.  RNFA as been requested.

## 2020-05-08 NOTE — Telephone Encounter (Signed)
-----   Message from Homero Fellers, MD sent at 04/04/2020  4:25 PM EST ----- Surgery Booking Request Patient Full Name:  Jessica Woods  MRN: [id number]  DOB: Dec 27, 1970  Surgeon: Homero Fellers, MD  Requested Surgery Date and Time: future Primary Diagnosis AND Code: fibroid uterus Secondary Diagnosis and Code:  Surgical Procedure: robotic assisted total hysterectomy, bilateral salpingectomy, cystoscopy with ureteral stent placement. - book with urology  RNFA Requested?: Yes L&D Notification: No Admission Status: same day surgery Length of Surgery: 100 min Special Case Needs: No H&P: Yes Phone Interview???:  Yes Interpreter: No Medical Clearance:  No Special Scheduling Instructions: No Any known health/anesthesia issues, diabetes, sleep apnea, latex allergy, defibrillator/pacemaker?: No Acuity: P3   (P1 highest, P2 delay may cause harm, P3 low, elective gyn, P4 lowest)

## 2020-05-09 ENCOUNTER — Other Ambulatory Visit: Payer: Self-pay | Admitting: Radiology

## 2020-05-11 ENCOUNTER — Other Ambulatory Visit: Payer: Self-pay | Admitting: Radiology

## 2020-05-16 NOTE — Telephone Encounter (Signed)
Orders placed thank you.

## 2020-05-18 ENCOUNTER — Ambulatory Visit (INDEPENDENT_AMBULATORY_CARE_PROVIDER_SITE_OTHER): Payer: BC Managed Care – PPO | Admitting: Obstetrics and Gynecology

## 2020-05-18 ENCOUNTER — Other Ambulatory Visit: Payer: Self-pay

## 2020-05-18 ENCOUNTER — Encounter: Payer: Self-pay | Admitting: Obstetrics and Gynecology

## 2020-05-18 VITALS — BP 120/70 | Ht 61.0 in | Wt 195.2 lb

## 2020-05-18 DIAGNOSIS — D252 Subserosal leiomyoma of uterus: Secondary | ICD-10-CM

## 2020-05-18 DIAGNOSIS — D251 Intramural leiomyoma of uterus: Secondary | ICD-10-CM

## 2020-05-18 DIAGNOSIS — N920 Excessive and frequent menstruation with regular cycle: Secondary | ICD-10-CM | POA: Diagnosis not present

## 2020-05-18 NOTE — Progress Notes (Signed)
  H&P 

## 2020-05-18 NOTE — Patient Instructions (Signed)
Hysterectomy Information °A hysterectomy is a surgery in which the uterus is removed. The lowest part of the uterus (cervix), which opens into the vagina, may be removed as well. In some cases, the fallopian tubes, the ovaries,  or both the fallopian tubes and the ovaries may also be removed. °This procedure may be done to treat different medical problems. It may also be done to help transgender men feel more masculine. After the procedure, a woman will no longer have menstrual periods and will not be able to become pregnant (sterile). °What are the reasons for a hysterectomy? °There are many reasons why a person might have this procedure. They include: °Persistent, abnormal vaginal bleeding. °Long-term (chronic) pelvic pain or infection. °Endometriosis. This is when the lining of the uterus (endometrium) starts to grow outside the uterus. °Adenomyosis. This is when the endometrium starts to grow in the muscle of the uterus. °Pelvic organ prolapse. This is a condition in which the uterus falls down into the vagina. °Noncancerous growths in the uterus (uterine fibroids) that cause symptoms. °The presence of precancerous cells. °Cervical or uterine cancer. °Sex change. This helps a transgender man complete his female identity. °What are the different types of hysterectomy? °There are three different types of hysterectomy: °Supracervical hysterectomy. In this type, the top part of the uterus is removed, but not the cervix. °Total hysterectomy. In this type, the uterus and cervix are removed. °Radical hysterectomy. In this type, the uterus, the cervix, and the tissue that holds the uterus in place (parametrium) are removed. °What are the different ways a hysterectomy can be performed? °There are many different ways a hysterectomy can be performed, including: °Abdominal hysterectomy. In this type, an incision is made in the abdomen. The uterus is removed through this incision. °Vaginal hysterectomy. In this type, an  incision is made in the vagina. The uterus is removed through this incision. There are no abdominal incisions. °Conventional laparoscopic hysterectomy. In this type, 3 or 4 small incisions are made in the abdomen. A thin, lighted tube with a camera (laparoscope) is inserted into one of the incisions. Other tools are put through the other incisions. The uterus is cut into small pieces. The small pieces are removed through the incisions or the vagina. °Laparoscopically assisted vaginal hysterectomy (LAVH). In this type, 3 or 4 small incisions are made in the abdomen. Part of the surgery is performed laparoscopically and the other part is done vaginally. The uterus is removed through the vagina. °Robot-assisted laparoscopic hysterectomy. In this type, a laparoscope and other tools are inserted into 3 or 4 small incisions in the abdomen. A computer-controlled device is used to give the surgeon a 3D image and to help control the surgical instruments. This allows for more precise movements of surgical instruments. The uterus is cut into small pieces and removed through the incisions or the vagina. °Discuss the options with your health care provider to determine which type is the right one for you. °What are the risks of this surgery? °Generally, this is a safe procedure. However, problems may occur, including: °Bleeding and risk of blood transfusion. Tell your health care provider if you do not want to receive any blood products. °Blood clots in the legs or lung. °Infection. °Damage to nearby structures or organs. °Allergic reactions to medicines. °Having to change to an abdominal hysterectomy after starting a less invasive technique. °What to expect after a hysterectomy °You will be given pain medicine. °You may need to stay in the hospital for 1-2 days   technique. What to expect after a hysterectomy  You will be given pain medicine.  You may need to stay in the hospital for 1-2 days to recover, depending on the type of hysterectomy you had.  You will need to have someone with you for the first  3-5 days after you go home.  You will need to follow up with your surgeon in 2-4 weeks after surgery to evaluate your progress.  If the ovaries are removed, you will have early menopause symptoms such as hot flashes, night sweats, and insomnia. If you had a hysterectomy for a problem that was not cancer or a condition that could not lead to cancer, then you no longer need Pap tests. However, even if you no longer need a Pap test, get regular pelvic exams to make sure no other problems are developing. Questions to ask your health care provider  Is a hysterectomy medically necessary? Do I have other treatment options for my condition?  What are my options for hysterectomy procedure?  What organs and tissues need to be removed?  What are the risks?  What are the benefits?  How long will I need to stay in the hospital after the procedure?  How long will I need to recover at home?  What symptoms can I expect after the procedure? Summary  A hysterectomy is a surgery in which the uterus is removed. The fallopian tubes, the ovaries, or both may be removed as well.  This procedure may be done to treat different medical problems. It may also be done to complete your female identity during a sex change.  After the procedure, a woman will no longer have a menstrual period and will not be able to become pregnant.  There are three types of hysterectomy. Discuss with your health care provider which options are right for you.  This is a safe procedure, though there are some risks. Risks include infection, bleeding, blood clots, or damage to nearby organs. This information is not intended to replace advice given to you by your health care provider. Make sure you discuss any questions you have with your health care provider. Document Revised: 02/11/2019 Document Reviewed: 02/11/2019 Elsevier Patient Education  2021 [employer] Laparoscopically Assisted Vaginal Hysterectomy, Care After The  following information offers guidance on how to care for yourself after your procedure. Your health care provider may also give you more specific instructions. If you have problems or questions, contact your health care provider. What can I expect after the procedure? After the procedure, it is common to have:  Soreness and numbness in your incision areas.  Abdominal pain. You will be given pain medicine to control it.  Vaginal bleeding and discharge. You will need to use a sanitary pad after this procedure.  Tiredness (fatigue).  Poor appetite.  Less interest in sex.  Feelings of sadness or other emotions. If your ovaries were also removed, it is common to have symptoms of menopause, such as hot flashes, night sweats, and lack of sleep (insomnia). Follow these instructions at home: Medicines  Take over-the-counter and prescription medicines only as told by your health care provider.  Do not take aspirin or NSAIDs, such as ibuprofen. These medicines can cause bleeding.  Ask your health care provider if the medicine prescribed to you: ? Requires you to avoid driving or using heavy machinery. ? Can cause constipation. You may need to take these actions to prevent or treat constipation:  Drink enough fluid to keep your urine pale yellow.    Take over-the-counter or prescription medicines.  Eat foods that are high in fiber, such as beans, whole grains, and fresh fruits and vegetables.  Limit foods that are high in fat and processed sugars, such as fried or sweet foods. Incision care  Follow instructions from your health care provider about how to take care of your incisions. Make sure you: ? Wash your hands with soap and water for at least 20 seconds before and after you change your bandage (dressing). If soap and water are not available, use hand sanitizer. ? Change your dressing as told by your health care provider. ? Leave stitches (sutures), skin glue, or adhesive strips in  place. These skin closures may need to stay in place for 2 weeks or longer. If adhesive strip edges start to loosen and curl up, you may trim the loose edges. Do not remove adhesive strips completely unless your health care provider tells you to do that.  Check your incision areas every day for signs of infection. Check for: ? More redness, swelling, or pain. ? Fluid or blood. ? Warmth. ? Pus or a bad smell.   Activity  Rest as told by your healthcare provider.  Return to your normal activities as told by your health care provider. Ask your health care provider what activities are safe for you.  Avoid sitting for a long time without moving. Get up to take short walks every 1-2 hours. This is important to improve blood flow and breathing. Ask for help if you feel weak or unsteady.  Do not lift anything that is heavier than 10 lb (4.5 kg), or the limit that you are told, until your health care provider says that it is safe.  If you were given a sedative during the procedure, it can affect you for several hours. Do not drive or operate machinery until your health care provider says that it is safe.   Lifestyle  Do not use any products that contain nicotine or tobacco. These products include cigarettes, chewing tobacco, and vaping devices, such as e-cigarettes. These can delay healing after surgery. If you need help quitting, ask your health care provider.  Do not drink alcohol until your health care provider approves. General instructions  Do not douche, use tampons, or have sex for at least 6 weeks, or as told by your health care provider.  If you struggle with physical or emotional changes after your procedure, speak with your health care provider or a therapist.  Do not take baths, swim, or use a hot tub until your health care provider approves. You may only be allowed to take showers for 2-3 weeks.  Keep your dressing dry until your health care provider says it can be removed.  Try  to have someone at home with you for the first 1-2 weeks to help with your daily chores.  Wear compression stockings as told by your health care provider. These stockings help to prevent blood clots and reduce swelling in your legs.  Keep all follow-up visits. This is important.   Contact a health care provider if:  You have any of these signs of infection: ? More redness, swelling, warmth, or pain around an incision. ? Fluid or blood coming from an incision. ? Pus or a bad smell coming from an incision. ? Chills or a fever.  An incision opens.  Your pain medicine is not helping.  You feel dizzy or light-headed.  You have pain or bleeding when you urinate.  You have nausea   and vomiting that does not go away.  You have pus, or a bad-smelling discharge coming from your vagina. Get help right away if:  You have a fever and your symptoms suddenly get worse.  You have severe abdominal pain.  You have chest pain.  You have shortness of breath.  You faint.  You have pain, swelling, or redness in your leg.  You have heavy vaginal bleeding and blood clots, soaking through a sanitary pad in less than 1 hour. These symptoms may represent a serious problem that is an emergency. Do not wait to see if the symptoms will go away. Get medical help right away. Call your local emergency services (911 in the [place] Do not drive yourself to the hospital. Summary  After the procedure, it is common to have abdominal pain and vaginal bleeding.  Wear a sanitary pad for vaginal discharge or bleeding.  You should not drive or lift heavy objects until your health care provider says that it is safe.  Contact your health care provider if you have any symptoms of infection, heavy vaginal bleeding, nausea, vomiting, or shortness of breath. This information is not intended to replace advice given to you by your health care provider. Make sure you discuss any questions you have with your health care  provider. Document Revised: 12/16/2019 Document Reviewed: 12/16/2019 Elsevier Patient Education  2021 [employer]  

## 2020-05-18 NOTE — H&P (View-Only) (Signed)
Patient ID: Jessica Woods, female   DOB: 1970/09/09, 50 y.o.   MRN: [id number]  Reason for Consult: Pre-op Exam   Referred by No ref. provider found  Subjective:     HPI:  Jessica Woods is a 50 y.o. female. She has been having menorrhagia secondary to her enlarged multi-fibroid uterus.  She has been counseled regarding her options for management of menorrhagia and fibroids. She desires definitive therapy by hysterectomy and is here for her preop visit. She has not other complaints at this time.  Pap: 03/07/2020- LSIL,  Colposcopy: CIN 1 Endometrial Biopsy: 04/04/2020:  Scant superficial strips of endometrial glandular type epithelium  present in the background of a majority of endocervical/lower uterine  segment type mucosa. No malignancy identified.    Past Medical History:  Diagnosis Date  . Fibroid    Family History  Problem Relation Age of Onset  . Heart failure Mother   . Hyperlipidemia Mother   . Heart failure Father    History reviewed. No pertinent surgical history.  Short Social History:  Social History   Tobacco Use  . Smoking status: Never Smoker  . Smokeless tobacco: Never Used  Substance Use Topics  . Alcohol use: Never    Allergies  Allergen Reactions  . Mushroom Extract Complex Nausea And Vomiting    Abdominal pain  . Other Nausea And Vomiting    Honey - nausea and abdominal pain   . Spinach Diarrhea    Abdominal pain    Current Outpatient Medications  Medication Sig Dispense Refill  . ascorbic acid (VITAMIN C) 500 MG tablet Take 500 mg by mouth daily.    Marland Kitchen bismuth subsalicylate (PEPTO BISMOL) 262 MG chewable tablet Chew 262 mg by mouth as needed for indigestion.    . COD LIVER OIL PO Take 1 capsule by mouth daily.    Marland Kitchen ibuprofen (ADVIL) 200 MG tablet Take 200 mg by mouth every 6 (six) hours as needed for headache or moderate pain.    . Multiple Minerals (CALCIUM/MAGNESIUM/ZINC PO) Take 1 tablet by mouth daily.    . Multiple Vitamin  (MULTIVITAMIN WITH MINERALS) TABS tablet Take 1 tablet by mouth daily.     No current facility-administered medications for this visit.    Review of Systems  Constitutional: Negative for chills, fatigue, fever and unexpected weight change.  HENT: Negative for trouble swallowing.  Eyes: Negative for loss of vision.  Respiratory: Negative for cough, shortness of breath and wheezing.  Cardiovascular: Negative for chest pain, leg swelling, palpitations and syncope.  GI: Negative for abdominal pain, blood in stool, diarrhea, nausea and vomiting.  GU: Negative for difficulty urinating, dysuria, frequency and hematuria.  Musculoskeletal: Negative for back pain, leg pain and joint pain.  Skin: Negative for rash.  Neurological: Negative for dizziness, headaches, light-headedness, numbness and seizures.  Psychiatric: Negative for behavioral problem, confusion, depressed mood and sleep disturbance.        Objective:  Objective   There were no vitals filed for this visit. There is no height or weight on file to calculate BMI.  Physical Exam Vitals and nursing note reviewed.  Constitutional:      Appearance: She is well-developed and well-nourished.  HENT:     Head: Normocephalic and atraumatic.  Eyes:     Extraocular Movements: EOM normal.     Pupils: Pupils are equal, round, and reactive to light.  Cardiovascular:     Rate and Rhythm: Normal rate and regular rhythm.  Pulmonary:  Effort: Pulmonary effort is normal. No respiratory distress.  Skin:    General: Skin is warm and dry.  Neurological:     Mental Status: She is alert and oriented to person, place, and time.  Psychiatric:        Mood and Affect: Mood and affect normal.        Behavior: Behavior normal.        Thought Content: Thought content normal.        Judgment: Judgment normal.     Assessment/Plan:     50 yo  Enlarged fibroid uterus planning robotic assisted total hysterectomy with bilateral salpingectomy and  cystoscopy for definitve management of menorrhagia.  We discussed morcellation and risks of seeding the abdominal cavity associated with morcellation. Steps to avoid this will include bag morcellation. She understands these risks.  We discussed extension of an abdominal incision to facilitate removal of her uterus.   I have had a careful discussion with this patient about all the options available and the risk/benefits of each. I have fully informed this patient that a hysterectomy may subject her to a variety of discomforts and risks: She understands that most patients have surgery with little difficulty, but problems can happen ranging from minor to fatal. These include nausea, vomiting, pain, bleeding, infection, poor healing, hernia, wound separation, vaginal cuff separation or formation of adhesions. Unexpected reactions may occur from any drug or anesthetic given. Unintended injury may occur to other pelvic or abdominal structures such as Fallopian tubes, ovaries, bladder, ureter (tube from kidney to bladder), or bowel. Nerves going from the pelvis to the legs may be injured. Any such injury may require immediate or later additional surgery to correct the problem. Excessive blood loss requiring transfusion is very unlikely but possible. Dangerous blood clots may form in the legs or lungs. Physical and sexual activity will be restricted in varying degrees for an indeterminate period of time but most often 2-4 weeks. She understands that the plan is to do this laparoscopically, however, there is a chance that this will need to be performed via a larger incision. She may be hospitalized overnight. Finally, she understands that it is impossible to list every possible undesirable effect and that the condition for which surgery is done is not always cured or significantly improved, and in rare cases may be even worse. I have also counseled her extensively about the pros and cons of ovarian conservation versus  removal. Ample time was given to answer all questions.  More than 40 minutes were spent face to face with the patient in the room, reviewing the medical record, labs and images, and coordinating care for the patient. The plan of management was discussed in detail and counseling was provided.     Adrian Prows MD [facility], [facility] 05/18/2020 8:16 AM

## 2020-05-18 NOTE — Progress Notes (Signed)
 Patient ID: Jessica Woods, female   DOB: 02/19/1971, 50 y.o.   MRN: [id number]  Reason for Consult: Pre-op Exam   Referred by No ref. provider found  Subjective:     HPI:  Jessica Woods is a 50 y.o. female. She has been having menorrhagia secondary to her enlarged multi-fibroid uterus.  She has been counseled regarding her options for management of menorrhagia and fibroids. She desires definitive therapy by hysterectomy and is here for her preop visit. She has not other complaints at this time.  Pap: 03/07/2020- LSIL,  Colposcopy: CIN 1 Endometrial Biopsy: 04/04/2020:  Scant superficial strips of endometrial glandular type epithelium  present in the background of a majority of endocervical/lower uterine  segment type mucosa. No malignancy identified.    Past Medical History:  Diagnosis Date  . Fibroid    Family History  Problem Relation Age of Onset  . Heart failure Mother   . Hyperlipidemia Mother   . Heart failure Father    History reviewed. No pertinent surgical history.  Short Social History:  Social History   Tobacco Use  . Smoking status: Never Smoker  . Smokeless tobacco: Never Used  Substance Use Topics  . Alcohol use: Never    Allergies  Allergen Reactions  . Mushroom Extract Complex Nausea And Vomiting    Abdominal pain  . Other Nausea And Vomiting    Honey - nausea and abdominal pain   . Spinach Diarrhea    Abdominal pain    Current Outpatient Medications  Medication Sig Dispense Refill  . ascorbic acid (VITAMIN C) 500 MG tablet Take 500 mg by mouth daily.    . bismuth subsalicylate (PEPTO BISMOL) 262 MG chewable tablet Chew 262 mg by mouth as needed for indigestion.    . COD LIVER OIL PO Take 1 capsule by mouth daily.    . ibuprofen (ADVIL) 200 MG tablet Take 200 mg by mouth every 6 (six) hours as needed for headache or moderate pain.    . Multiple Minerals (CALCIUM/MAGNESIUM/ZINC PO) Take 1 tablet by mouth daily.    . Multiple Vitamin  (MULTIVITAMIN WITH MINERALS) TABS tablet Take 1 tablet by mouth daily.     No current facility-administered medications for this visit.    Review of Systems  Constitutional: Negative for chills, fatigue, fever and unexpected weight change.  HENT: Negative for trouble swallowing.  Eyes: Negative for loss of vision.  Respiratory: Negative for cough, shortness of breath and wheezing.  Cardiovascular: Negative for chest pain, leg swelling, palpitations and syncope.  GI: Negative for abdominal pain, blood in stool, diarrhea, nausea and vomiting.  GU: Negative for difficulty urinating, dysuria, frequency and hematuria.  Musculoskeletal: Negative for back pain, leg pain and joint pain.  Skin: Negative for rash.  Neurological: Negative for dizziness, headaches, light-headedness, numbness and seizures.  Psychiatric: Negative for behavioral problem, confusion, depressed mood and sleep disturbance.        Objective:  Objective   There were no vitals filed for this visit. There is no height or weight on file to calculate BMI.  Physical Exam Vitals and nursing note reviewed.  Constitutional:      Appearance: She is well-developed and well-nourished.  HENT:     Head: Normocephalic and atraumatic.  Eyes:     Extraocular Movements: EOM normal.     Pupils: Pupils are equal, round, and reactive to light.  Cardiovascular:     Rate and Rhythm: Normal rate and regular rhythm.  Pulmonary:       Effort: Pulmonary effort is normal. No respiratory distress.  Skin:    General: Skin is warm and dry.  Neurological:     Mental Status: She is alert and oriented to person, place, and time.  Psychiatric:        Mood and Affect: Mood and affect normal.        Behavior: Behavior normal.        Thought Content: Thought content normal.        Judgment: Judgment normal.     Assessment/Plan:     50 yo  Enlarged fibroid uterus planning robotic assisted total hysterectomy with bilateral salpingectomy and  cystoscopy for definitve management of menorrhagia.  We discussed morcellation and risks of seeding the abdominal cavity associated with morcellation. Steps to avoid this will include bag morcellation. She understands these risks.  We discussed extension of an abdominal incision to facilitate removal of her uterus.   I have had a careful discussion with this patient about all the options available and the risk/benefits of each. I have fully informed this patient that a hysterectomy may subject her to a variety of discomforts and risks: She understands that most patients have surgery with little difficulty, but problems can happen ranging from minor to fatal. These include nausea, vomiting, pain, bleeding, infection, poor healing, hernia, wound separation, vaginal cuff separation or formation of adhesions. Unexpected reactions may occur from any drug or anesthetic given. Unintended injury may occur to other pelvic or abdominal structures such as Fallopian tubes, ovaries, bladder, ureter (tube from kidney to bladder), or bowel. Nerves going from the pelvis to the legs may be injured. Any such injury may require immediate or later additional surgery to correct the problem. Excessive blood loss requiring transfusion is very unlikely but possible. Dangerous blood clots may form in the legs or lungs. Physical and sexual activity will be restricted in varying degrees for an indeterminate period of time but most often 2-4 weeks. She understands that the plan is to do this laparoscopically, however, there is a chance that this will need to be performed via a larger incision. She may be hospitalized overnight. Finally, she understands that it is impossible to list every possible undesirable effect and that the condition for which surgery is done is not always cured or significantly improved, and in rare cases may be even worse. I have also counseled her extensively about the pros and cons of ovarian conservation versus  removal. Ample time was given to answer all questions.  More than 40 minutes were spent face to face with the patient in the room, reviewing the medical record, labs and images, and coordinating care for the patient. The plan of management was discussed in detail and counseling was provided.     Adrian Prows MD [facility] 05/18/2020 8:16 AM

## 2020-05-22 ENCOUNTER — Other Ambulatory Visit
Admission: RE | Admit: 2020-05-22 | Discharge: 2020-05-22 | Disposition: A | Discharge status: Home / Self Care | Payer: BC Managed Care – PPO | Source: Ambulatory Visit | Attending: Obstetrics and Gynecology | Admitting: Obstetrics and Gynecology

## 2020-05-22 ENCOUNTER — Other Ambulatory Visit: Payer: Self-pay

## 2020-05-22 HISTORY — DX: Depression, unspecified: F32.A

## 2020-05-22 NOTE — Patient Instructions (Signed)
Your procedure is scheduled on: 05/29/2020 Tuesday  Report to the Registration Desk on the 1st floor of the [facility]. To find out your arrival time, please call [phone] between 1PM - 3PM on: May 28, 2020 MONDAY  REMEMBER: Instructions that are not followed completely may result in serious medical risk, up to and including death; or upon the discretion of your surgeon and anesthesiologist your surgery may need to be rescheduled.  Do not eat food after midnight the night before surgery.  No gum chewing, lozengers or hard candies.  You may however, drink CLEAR liquids up to 2 hours before you are scheduled to arrive for your surgery. Do not drink anything within 2 hours of your scheduled arrival time.  Clear liquids include: - water  - apple juice without pulp - gatorade (not RED, PURPLE, OR BLUE) - black coffee or tea (Do NOT add milk or creamers to the coffee or tea) Do NOT drink anything that is not on this list.  Type 1 and Type 2 diabetics should only drink water.  TAKE THESE MEDICATIONS THE MORNING OF SURGERY WITH A SIP OF WATER: NONE  One week prior to surgery: Stop Anti-inflammatories (NSAIDS) such as Advil, Aleve, Ibuprofen, Motrin, Naproxen, Naprosyn and Aspirin based products such as Excedrin, Goodys Powder, BC Powder. Stop ANY OVER THE COUNTER supplements until after surgery. (However, you may continue taking Vitamin D, Vitamin B, and multivitamin up until the day before surgery.)  No Alcohol for 24 hours before or after surgery.  No Smoking including e-cigarettes for 24 hours prior to surgery.  No chewable tobacco products for at least 6 hours prior to surgery.  No nicotine patches on the day of surgery.  Do not use any "recreational" drugs for at least a week prior to your surgery.  Please be advised that the combination of cocaine and anesthesia may have negative outcomes, up to and including death. If you test positive for cocaine, your surgery will be  cancelled.  On the morning of surgery brush your teeth with toothpaste and water, you may rinse your mouth with mouthwash if you wish. Do not swallow any toothpaste or mouthwash.  Do not wear jewelry, make-up, hairpins, clips or nail polish.  Do not wear lotions, powders, or perfumes OR DEODORANT   Do not shave body from the neck down 48 hours prior to surgery just in case you cut yourself which could leave a site for infection.  Also, freshly shaved skin may become irritated if using the CHG soap.  Contact lenses, hearing aids and dentures may not be worn into surgery.  Do not bring valuables to the hospital. [facility] is not responsible for any missing/lost belongings or valuables.   Use CHG Soap  as directed on instruction sheet.  Notify your doctor if there is any change in your medical condition (cold, fever, infection).  Wear comfortable clothing (specific to your surgery type) to the hospital.  Plan for stool softeners for home use; pain medications have a tendency to cause constipation. You can also help prevent constipation by eating foods high in fiber such as fruits and vegetables and drinking plenty of fluids as your diet allows.  After surgery, you can help prevent lung complications by doing breathing exercises.  Take deep breaths and cough every 1-2 hours. Your doctor may order a device called an Incentive Spirometer to help you take deep breaths. When coughing or sneezing, hold a pillow firmly against your incision with both hands. This is  called "splinting." Doing this helps protect your incision. It also decreases belly discomfort.  If you are being discharged the day of surgery, you will not be allowed to drive home. You will need a responsible adult (18 years or older) to drive you home and stay with you that night.   Please call the [facility] Dept. at [phone] if you have any questions about these instructions.  Visitation  Policy:  Patients undergoing a surgery or procedure may have one family member or support person with them as long as that person is not COVID-19 positive or experiencing its symptoms.  That person may remain in the waiting area during the procedure.  Inpatient Visitation:    Visiting hours are 7 a.m. to 8 p.m. Patients will be allowed one visitor. The visitor may change daily. The visitor must pass COVID-19 screenings, use hand sanitizer when entering and exiting the patient's room and wear a mask at all times, including in the patient's room. Patients must also wear a mask when staff or their visitor are in the room. Masking is required regardless of vaccination status. Systemwide, no visitors 17 or younger.

## 2020-05-25 ENCOUNTER — Other Ambulatory Visit: Payer: Self-pay

## 2020-05-25 ENCOUNTER — Encounter
Admission: RE | Admit: 2020-05-25 | Discharge: 2020-05-25 | Disposition: A | Discharge status: Home / Self Care | Payer: BC Managed Care – PPO | Source: Ambulatory Visit | Attending: Obstetrics and Gynecology | Admitting: Obstetrics and Gynecology

## 2020-05-25 DIAGNOSIS — Z20822 Contact with and (suspected) exposure to covid-19: Secondary | ICD-10-CM | POA: Insufficient documentation

## 2020-05-25 DIAGNOSIS — Z01812 Encounter for preprocedural laboratory examination: Secondary | ICD-10-CM | POA: Diagnosis not present

## 2020-05-25 LAB — TYPE AND SCREEN
ABO/RH(D): A POS
Antibody Screen: NEGATIVE

## 2020-05-25 LAB — CBC
HCT: 40.4 % (ref 36.0–46.0)
Hemoglobin: 13.6 g/dL (ref 12.0–15.0)
MCH: 29 pg (ref 26.0–34.0)
MCHC: 33.7 g/dL (ref 30.0–36.0)
MCV: 86.1 fL (ref 80.0–100.0)
Platelets: 216 10*3/uL (ref 150–400)
RBC: 4.69 MIL/uL (ref 3.87–5.11)
RDW: 15.4 % (ref 11.5–15.5)
WBC: 4.7 10*3/uL (ref 4.0–10.5)
nRBC: 0 % (ref 0.0–0.2)

## 2020-05-25 LAB — SARS CORONAVIRUS 2 (TAT 6-24 HRS): SARS Coronavirus 2: NEGATIVE

## 2020-05-28 MED ORDER — FAMOTIDINE 20 MG PO TABS: 20.0000 mg | ORAL_TABLET | Freq: Once | ORAL | Status: AC

## 2020-05-28 MED ORDER — LACTATED RINGERS IV SOLN: INTRAVENOUS | Status: DC

## 2020-05-28 MED ORDER — CEFAZOLIN SODIUM-DEXTROSE 2-4 GM/100ML-% IV SOLN
2.0000 g | INTRAVENOUS | Status: AC
  Administered 2020-05-29 (×2): 2 g via INTRAVENOUS

## 2020-05-28 MED ORDER — POVIDONE-IODINE 10 % EX SWAB: 2.0000 "application " | Freq: Once | CUTANEOUS | Status: DC

## 2020-05-29 ENCOUNTER — Encounter
Admission: RE | Disposition: A | Discharge status: Home / Self Care | Payer: Self-pay | Source: Home / Self Care | Attending: Obstetrics and Gynecology

## 2020-05-29 ENCOUNTER — Other Ambulatory Visit: Payer: Self-pay

## 2020-05-29 ENCOUNTER — Telehealth: Payer: Self-pay

## 2020-05-29 ENCOUNTER — Ambulatory Visit: Payer: BC Managed Care – PPO

## 2020-05-29 ENCOUNTER — Ambulatory Visit
Admission: RE | Admit: 2020-05-29 | Discharge: 2020-05-29 | Disposition: A | Discharge status: Home / Self Care | Payer: BC Managed Care – PPO | Attending: Obstetrics and Gynecology | Admitting: Obstetrics and Gynecology

## 2020-05-29 DIAGNOSIS — D259 Leiomyoma of uterus, unspecified: Secondary | ICD-10-CM | POA: Diagnosis not present

## 2020-05-29 DIAGNOSIS — D252 Subserosal leiomyoma of uterus: Secondary | ICD-10-CM | POA: Diagnosis not present

## 2020-05-29 DIAGNOSIS — Z91018 Allergy to other foods: Secondary | ICD-10-CM | POA: Diagnosis not present

## 2020-05-29 DIAGNOSIS — N920 Excessive and frequent menstruation with regular cycle: Secondary | ICD-10-CM | POA: Diagnosis not present

## 2020-05-29 DIAGNOSIS — D251 Intramural leiomyoma of uterus: Secondary | ICD-10-CM

## 2020-05-29 DIAGNOSIS — N840 Polyp of corpus uteri: Secondary | ICD-10-CM | POA: Diagnosis not present

## 2020-05-29 DIAGNOSIS — Z408 Encounter for other prophylactic surgery: Secondary | ICD-10-CM | POA: Diagnosis not present

## 2020-05-29 HISTORY — PX: ROBOTIC ASSISTED TOTAL HYSTERECTOMY WITH BILATERAL SALPINGO OOPHERECTOMY: SHX6086

## 2020-05-29 HISTORY — PX: CYSTOSCOPY: SHX5120

## 2020-05-29 LAB — ABO/RH: ABO/RH(D): A POS

## 2020-05-29 LAB — POCT PREGNANCY, URINE: Preg Test, Ur: NEGATIVE

## 2020-05-29 SURGERY — HYSTERECTOMY, TOTAL, ROBOT-ASSISTED, LAPAROSCOPIC, WITH BILATERAL SALPINGO-OOPHORECTOMY
Anesthesia: General | Site: Ureter | Laterality: Bilateral

## 2020-05-29 MED ORDER — ONDANSETRON HCL 4 MG/2ML IJ SOLN
4.0000 mg | Freq: Once | INTRAMUSCULAR | Status: AC | PRN
  Administered 2020-05-29: 4 mg via INTRAVENOUS

## 2020-05-29 MED ORDER — CEFAZOLIN SODIUM-DEXTROSE 2-4 GM/100ML-% IV SOLN
INTRAVENOUS | Status: AC
  Filled 2020-05-29: qty 100

## 2020-05-29 MED ORDER — METOCLOPRAMIDE HCL 10 MG PO TABS: 10.0000 mg | ORAL_TABLET | Freq: Three times a day (TID) | ORAL | 0 refills | Status: DC

## 2020-05-29 MED ORDER — HEMOSTATIC AGENTS (NO CHARGE) OPTIME
TOPICAL | Status: DC | PRN
  Administered 2020-05-29 (×2): 1 via TOPICAL

## 2020-05-29 MED ORDER — ROCURONIUM BROMIDE 100 MG/10ML IV SOLN
INTRAVENOUS | Status: DC | PRN
  Administered 2020-05-29: 60 mg via INTRAVENOUS
  Administered 2020-05-29: 40 mg via INTRAVENOUS
  Administered 2020-05-29: 10 mg via INTRAVENOUS
  Administered 2020-05-29 (×2): 20 mg via INTRAVENOUS

## 2020-05-29 MED ORDER — FAMOTIDINE 20 MG PO TABS
ORAL_TABLET | ORAL | Status: AC
  Administered 2020-05-29: 20 mg via ORAL
  Filled 2020-05-29: qty 1

## 2020-05-29 MED ORDER — FENTANYL CITRATE (PF) 100 MCG/2ML IJ SOLN: 25.0000 ug | INTRAMUSCULAR | Status: DC | PRN

## 2020-05-29 MED ORDER — DEXAMETHASONE SODIUM PHOSPHATE 10 MG/ML IJ SOLN
INTRAMUSCULAR | Status: AC
  Filled 2020-05-29: qty 1

## 2020-05-29 MED ORDER — KETAMINE HCL 10 MG/ML IJ SOLN
INTRAMUSCULAR | Status: DC | PRN
  Administered 2020-05-29: 20 mg via INTRAVENOUS
  Administered 2020-05-29: 10 mg via INTRAVENOUS
  Administered 2020-05-29: 20 mg via INTRAVENOUS
  Administered 2020-05-29 (×2): 10 mg via INTRAVENOUS

## 2020-05-29 MED ORDER — DEXMEDETOMIDINE (PRECEDEX) IN NS 20 MCG/5ML (4 MCG/ML) IV SYRINGE
PREFILLED_SYRINGE | INTRAVENOUS | Status: DC | PRN
  Administered 2020-05-29 (×3): 4 ug via INTRAVENOUS
  Administered 2020-05-29: 8 ug via INTRAVENOUS
  Administered 2020-05-29 (×3): 4 ug via INTRAVENOUS

## 2020-05-29 MED ORDER — SUGAMMADEX SODIUM 200 MG/2ML IV SOLN
INTRAVENOUS | Status: DC | PRN
  Administered 2020-05-29: 200 mg via INTRAVENOUS

## 2020-05-29 MED ORDER — DEXMEDETOMIDINE (PRECEDEX) IN NS 20 MCG/5ML (4 MCG/ML) IV SYRINGE
PREFILLED_SYRINGE | INTRAVENOUS | Status: AC
  Filled 2020-05-29: qty 5

## 2020-05-29 MED ORDER — STERILE WATER FOR INJECTION IJ SOLN
INTRAMUSCULAR | Status: DC | PRN
  Administered 2020-05-29: 20 mL via INTRAMUSCULAR

## 2020-05-29 MED ORDER — ONDANSETRON HCL 4 MG/2ML IJ SOLN
INTRAMUSCULAR | Status: AC
  Filled 2020-05-29: qty 2

## 2020-05-29 MED ORDER — MIDAZOLAM HCL 2 MG/2ML IJ SOLN
INTRAMUSCULAR | Status: DC | PRN
  Administered 2020-05-29: 2 mg via INTRAVENOUS

## 2020-05-29 MED ORDER — MIDAZOLAM HCL 2 MG/2ML IJ SOLN
INTRAMUSCULAR | Status: AC
  Filled 2020-05-29: qty 2

## 2020-05-29 MED ORDER — ACETAMINOPHEN 500 MG PO TABS: 1000.0000 mg | ORAL_TABLET | Freq: Four times a day (QID) | ORAL | 0 refills | Status: AC | PRN

## 2020-05-29 MED ORDER — CHLORHEXIDINE GLUCONATE 0.12 % MT SOLN
OROMUCOSAL | Status: AC
  Filled 2020-05-29: qty 15

## 2020-05-29 MED ORDER — OXYCODONE HCL 5 MG PO TABS: 5.0000 mg | ORAL_TABLET | Freq: Four times a day (QID) | ORAL | 0 refills | Status: AC | PRN

## 2020-05-29 MED ORDER — FENTANYL CITRATE (PF) 250 MCG/5ML IJ SOLN
INTRAMUSCULAR | Status: AC
  Filled 2020-05-29: qty 5

## 2020-05-29 MED ORDER — ORAL CARE MOUTH RINSE: 15.0000 mL | Freq: Once | OROMUCOSAL | Status: AC

## 2020-05-29 MED ORDER — DIPHENHYDRAMINE HCL 50 MG/ML IJ SOLN
INTRAMUSCULAR | Status: AC
  Filled 2020-05-29: qty 2

## 2020-05-29 MED ORDER — DIPHENHYDRAMINE HCL 50 MG/ML IJ SOLN
INTRAMUSCULAR | Status: DC | PRN
  Administered 2020-05-29: 12.5 mg via INTRAVENOUS

## 2020-05-29 MED ORDER — PROPOFOL 10 MG/ML IV BOLUS
INTRAVENOUS | Status: AC
  Filled 2020-05-29: qty 20

## 2020-05-29 MED ORDER — CEFAZOLIN SODIUM 1 G IJ SOLR
INTRAMUSCULAR | Status: AC
  Filled 2020-05-29: qty 20

## 2020-05-29 MED ORDER — LABETALOL HCL 5 MG/ML IV SOLN
INTRAVENOUS | Status: DC | PRN
  Administered 2020-05-29 (×4): 5 mg via INTRAVENOUS

## 2020-05-29 MED ORDER — BUPIVACAINE LIPOSOME 1.3 % IJ SUSP
INTRAMUSCULAR | Status: DC | PRN
  Administered 2020-05-29: 20 mL

## 2020-05-29 MED ORDER — FENTANYL CITRATE (PF) 100 MCG/2ML IJ SOLN
INTRAMUSCULAR | Status: AC
  Filled 2020-05-29: qty 2

## 2020-05-29 MED ORDER — FENTANYL CITRATE (PF) 100 MCG/2ML IJ SOLN
INTRAMUSCULAR | Status: DC | PRN
  Administered 2020-05-29 (×4): 50 ug via INTRAVENOUS
  Administered 2020-05-29: 25 ug via INTRAVENOUS
  Administered 2020-05-29: 50 ug via INTRAVENOUS

## 2020-05-29 MED ORDER — LIDOCAINE HCL (PF) 2 % IJ SOLN
INTRAMUSCULAR | Status: AC
  Filled 2020-05-29: qty 5

## 2020-05-29 MED ORDER — CHLORHEXIDINE GLUCONATE 0.12 % MT SOLN
15.0000 mL | Freq: Once | OROMUCOSAL | Status: AC
  Administered 2020-05-29: 15 mL via OROMUCOSAL

## 2020-05-29 MED ORDER — ROCURONIUM BROMIDE 10 MG/ML (PF) SYRINGE
PREFILLED_SYRINGE | INTRAVENOUS | Status: AC
  Filled 2020-05-29: qty 10

## 2020-05-29 MED ORDER — LIDOCAINE HCL (CARDIAC) PF 100 MG/5ML IV SOSY
PREFILLED_SYRINGE | INTRAVENOUS | Status: DC | PRN
  Administered 2020-05-29: 100 mg via INTRAVENOUS

## 2020-05-29 MED ORDER — IBUPROFEN 600 MG PO TABS: 600.0000 mg | ORAL_TABLET | Freq: Four times a day (QID) | ORAL | 0 refills | Status: DC | PRN

## 2020-05-29 MED ORDER — BUPIVACAINE LIPOSOME 1.3 % IJ SUSP
INTRAMUSCULAR | Status: AC
  Filled 2020-05-29: qty 20

## 2020-05-29 MED ORDER — ACETAMINOPHEN 10 MG/ML IV SOLN
INTRAVENOUS | Status: DC | PRN
  Administered 2020-05-29: 1000 mg via INTRAVENOUS

## 2020-05-29 MED ORDER — DEXAMETHASONE SODIUM PHOSPHATE 10 MG/ML IJ SOLN
INTRAMUSCULAR | Status: DC | PRN
  Administered 2020-05-29: 10 mg via INTRAVENOUS

## 2020-05-29 MED ORDER — ONDANSETRON HCL 4 MG/2ML IJ SOLN
INTRAMUSCULAR | Status: DC | PRN
  Administered 2020-05-29: 4 mg via INTRAVENOUS

## 2020-05-29 MED ORDER — HYDRALAZINE HCL 20 MG/ML IJ SOLN
INTRAMUSCULAR | Status: DC | PRN
  Administered 2020-05-29: 5 mg via INTRAVENOUS
  Administered 2020-05-29 (×2): 2.5 mg via INTRAVENOUS

## 2020-05-29 MED ORDER — KETOROLAC TROMETHAMINE 30 MG/ML IJ SOLN
INTRAMUSCULAR | Status: DC | PRN
  Administered 2020-05-29: 30 mg via INTRAVENOUS

## 2020-05-29 MED ORDER — ACETAMINOPHEN 10 MG/ML IV SOLN
INTRAVENOUS | Status: AC
  Filled 2020-05-29: qty 100

## 2020-05-29 MED ORDER — LABETALOL HCL 5 MG/ML IV SOLN
INTRAVENOUS | Status: AC
  Filled 2020-05-29: qty 4

## 2020-05-29 MED ORDER — PROPOFOL 10 MG/ML IV BOLUS
INTRAVENOUS | Status: DC | PRN
  Administered 2020-05-29: 170 mg via INTRAVENOUS

## 2020-05-29 MED ORDER — KETAMINE HCL 50 MG/5ML IJ SOSY
PREFILLED_SYRINGE | INTRAMUSCULAR | Status: AC
  Filled 2020-05-29: qty 5

## 2020-05-29 MED ORDER — LACTATED RINGERS IV SOLN: INTRAVENOUS | Status: DC

## 2020-05-29 SURGICAL SUPPLY — 102 items
ADH SKN CLS APL DERMABOND .7 (GAUZE/BANDAGES/DRESSINGS) ×3
APL PRP STRL LF DISP 70% ISPRP (MISCELLANEOUS) ×6
APL SRG 38 LTWT LNG FL B (MISCELLANEOUS) ×6
APPLICATOR ARISTA FLEXITIP XL (MISCELLANEOUS) ×8 IMPLANT
BAG DRAIN CYSTO-URO LG1000N (MISCELLANEOUS) ×4 IMPLANT
BAG DRN RND TRDRP ANRFLXCHMBR (UROLOGICAL SUPPLIES) ×3
BAG INFUSER PRESSURE 100CC (MISCELLANEOUS) ×4 IMPLANT
BAG LAPAROSCOPIC 12 15 PORT 16 (BASKET) ×3 IMPLANT
BAG RETRIEVAL 12/15 (BASKET) ×4
BAG URINE DRAIN 2000ML AR STRL (UROLOGICAL SUPPLIES) ×4 IMPLANT
BASIN GRAD PLASTIC 32OZ STRL (MISCELLANEOUS) ×4 IMPLANT
BLADE SURG SZ10 CARB STEEL (BLADE) ×4 IMPLANT
BLADE SURG SZ11 CARB STEEL (BLADE) ×12 IMPLANT
CANNULA REDUC XI 12-8 STAPL (CANNULA) ×1
CANNULA REDUCER 12-8 DVNC XI (CANNULA) ×3 IMPLANT
CATH FOLEY 2WAY  5CC 16FR (CATHETERS) ×2
CATH FOLEY 2WAY 5CC 16FR (CATHETERS) ×6
CATH URETL 5X70 OPEN END (CATHETERS) ×4 IMPLANT
CATH URTH 16FR FL 2W BLN LF (CATHETERS) ×6 IMPLANT
CELL SAVER LIPIGURD (MISCELLANEOUS) ×3 IMPLANT
CHLORAPREP W/TINT 26 (MISCELLANEOUS) ×8 IMPLANT
COVER BACK TABLE REUSABLE LG (DRAPES) IMPLANT
COVER LIGHT HANDLE STERIS (MISCELLANEOUS) ×4 IMPLANT
COVER MAYO STAND REUSABLE (DRAPES) ×4 IMPLANT
COVER TIP SHEARS 8 DVNC (MISCELLANEOUS) ×3 IMPLANT
COVER TIP SHEARS 8MM DA VINCI (MISCELLANEOUS) ×1
COVER WAND RF STERILE (DRAPES) ×4 IMPLANT
DEFOGGER SCOPE WARMER CLEARIFY (MISCELLANEOUS) ×4 IMPLANT
DERMABOND ADVANCED (GAUZE/BANDAGES/DRESSINGS) ×1
DERMABOND ADVANCED .7 DNX12 (GAUZE/BANDAGES/DRESSINGS) ×3 IMPLANT
DRAPE 3/4 80X56 (DRAPES) ×4 IMPLANT
DRAPE ARM DVNC X/XI (DISPOSABLE) ×12 IMPLANT
DRAPE COLUMN DVNC XI (DISPOSABLE) ×3 IMPLANT
DRAPE DA VINCI XI ARM (DISPOSABLE) ×4
DRAPE DA VINCI XI COLUMN (DISPOSABLE) ×1
DRSG TEGADERM 2-3/8X2-3/4 SM (GAUZE/BANDAGES/DRESSINGS) ×16 IMPLANT
ELECT REM PT RETURN 9FT ADLT (ELECTROSURGICAL) ×4
ELECTRODE REM PT RTRN 9FT ADLT (ELECTROSURGICAL) ×3 IMPLANT
EXTRT SYSTEM ALEXIS 14CM (MISCELLANEOUS) ×4
GLOVE BIOGEL PI IND STRL 7.5 (GLOVE) ×6 IMPLANT
GLOVE BIOGEL PI INDICATOR 7.5 (GLOVE) ×2
GLOVE SURG ENC MOIS LTX SZ7 (GLOVE) ×8 IMPLANT
GLOVE SURG UNDER POLY LF SZ7.5 (GLOVE) ×4 IMPLANT
GOWN STRL REUS W/ TWL LRG LVL3 (GOWN DISPOSABLE) ×27 IMPLANT
GOWN STRL REUS W/TWL LRG LVL3 (GOWN DISPOSABLE) ×36
GRASPER SUT TROCAR 14GX15 (MISCELLANEOUS) ×4 IMPLANT
HANDLE YANKAUER SUCT BULB TIP (MISCELLANEOUS) ×4 IMPLANT
HEMOSTAT ARISTA ABSORB 3G PWDR (HEMOSTASIS) ×8 IMPLANT
IRRIGATION STRYKERFLOW (MISCELLANEOUS) ×3 IMPLANT
IRRIGATOR STRYKERFLOW (MISCELLANEOUS) ×4
IRRIGATOR SUCT 8 DISP DVNC XI (IRRIGATION / IRRIGATOR) ×3 IMPLANT
IRRIGATOR SUCTION 8MM XI DISP (IRRIGATION / IRRIGATOR) ×1
IV NS 1000ML (IV SOLUTION) ×12
IV NS 1000ML BAXH (IV SOLUTION) ×9 IMPLANT
KIT IMAGING PINPOINTPAQ (MISCELLANEOUS) ×8 IMPLANT
KIT PINK PAD W/HEAD ARE REST (MISCELLANEOUS) ×4
KIT PINK PAD W/HEAD ARM REST (MISCELLANEOUS) ×3 IMPLANT
LABEL OR SOLS (LABEL) ×4 IMPLANT
MANIFOLD NEPTUNE II (INSTRUMENTS) ×4 IMPLANT
MANIPULATOR VCARE LG CRV RETR (MISCELLANEOUS) ×4 IMPLANT
MANIPULATOR VCARE SML CRV RETR (MISCELLANEOUS) IMPLANT
MANIPULATOR VCARE STD CRV RETR (MISCELLANEOUS) IMPLANT
NEEDLE HYPO 22GX1.5 SAFETY (NEEDLE) ×4 IMPLANT
NS IRRIG 1000ML POUR BTL (IV SOLUTION) ×8 IMPLANT
OBTURATOR OPTICAL STANDARD 8MM (TROCAR) ×1
OBTURATOR OPTICAL STND 8 DVNC (TROCAR) ×3
OBTURATOR OPTICALSTD 8 DVNC (TROCAR) ×3 IMPLANT
OCCLUDER COLPOPNEUMO (BALLOONS) ×4 IMPLANT
PACK CYSTO AR (MISCELLANEOUS) ×4 IMPLANT
PACK GYN LAPAROSCOPIC (MISCELLANEOUS) ×4 IMPLANT
PAD ARMBOARD 7.5X6 YLW CONV (MISCELLANEOUS) ×4 IMPLANT
PAD OB MATERNITY 4.3X12.25 (PERSONAL CARE ITEMS) ×4 IMPLANT
PAD PREP 24X41 OB/GYN DISP (PERSONAL CARE ITEMS) ×4 IMPLANT
RETRACTOR WOUND ALXS 18CM SML (MISCELLANEOUS) ×3 IMPLANT
RTRCTR WOUND ALEXIS O 18CM SML (MISCELLANEOUS) ×4
SEAL CANN UNIV 5-8 DVNC XI (MISCELLANEOUS) ×9 IMPLANT
SEAL XI 5MM-8MM UNIVERSAL (MISCELLANEOUS) ×3
SEALER VESSEL DA VINCI XI (MISCELLANEOUS) ×1
SEALER VESSEL EXT DVNC XI (MISCELLANEOUS) ×3 IMPLANT
SET CYSTO W/LG BORE CLAMP LF (SET/KITS/TRAYS/PACK) ×4 IMPLANT
SET TRI-LUMEN FLTR TB AIRSEAL (TUBING) ×4 IMPLANT
SOL .9 NS 3000ML IRR  AL (IV SOLUTION)
SOL .9 NS 3000ML IRR AL (IV SOLUTION)
SOL .9 NS 3000ML IRR UROMATIC (IV SOLUTION) IMPLANT
SOLUTION ELECTROLUBE (MISCELLANEOUS) ×4 IMPLANT
SPONGE GAUZE 2X2 8PLY STRL LF (GAUZE/BANDAGES/DRESSINGS) ×16 IMPLANT
SPONGE LAP 18X18 RF (DISPOSABLE) ×8 IMPLANT
STAPLER CANNULA SEAL DVNC XI (STAPLE) ×3 IMPLANT
STAPLER CANNULA SEAL XI (STAPLE) ×1
SURGILUBE 2OZ TUBE FLIPTOP (MISCELLANEOUS) ×8 IMPLANT
SUT DVC VLOC 180 0 12IN GS21 (SUTURE) ×8
SUT MNCRL 4-0 (SUTURE) ×4
SUT MNCRL 4-0 27XMFL (SUTURE) ×3
SUT VIC AB 0 CT1 36 (SUTURE) ×4 IMPLANT
SUT VICRYL 0 AB UR-6 (SUTURE) ×4 IMPLANT
SUTURE DVC VLC 180 0 12IN GS21 (SUTURE) ×6 IMPLANT
SUTURE MNCRL 4-0 27XMF (SUTURE) ×3 IMPLANT
SYR 10ML LL (SYRINGE) ×12 IMPLANT
SYR 50ML LL SCALE MARK (SYRINGE) ×4 IMPLANT
TROCAR BALLN 12MMX100 BLUNT (TROCAR) ×4 IMPLANT
TROCAR PORT AIRSEAL 8X100 (TROCAR) ×4 IMPLANT
WATER STERILE IRR 1000ML POUR (IV SOLUTION) IMPLANT

## 2020-05-29 NOTE — Transfer of Care (Cosign Needed)
Immediate Anesthesia Transfer of Care Note  Patient: Jessica Woods  Procedure(s) Performed: XI ROBOTIC ASSISTED TOTAL HYSTERECTOMY WITH BILATERAL SALPINGO OOPHORECTOMY (Bilateral ) INDOCYANINE GREEN FLUORESCENCE IMAGING (ICG) (Bilateral Ureter)  Patient Location: PACU  Anesthesia Type:General  Level of Consciousness: drowsy and patient cooperative  Airway & Oxygen Therapy: Patient Spontanous Breathing and Patient connected to face mask  Post-op Assessment: Report given to RN, Post -op Vital signs reviewed and stable and Patient moving all extremities X 4  Post vital signs: Reviewed and stable  Last Vitals:  Vitals Value Taken Time  BP 133/87 05/29/20 1330  Temp 36.2 C 05/29/20 1330  Pulse 70 05/29/20 1337  Resp 14 05/29/20 1337  SpO2 100 % 05/29/20 1337  Vitals shown include unvalidated device data.  Last Pain:  Vitals:   05/29/20 0614  TempSrc: Oral  PainSc: 0-No pain         Complications: No complications documented.

## 2020-05-29 NOTE — Discharge Instructions (Signed)
Laparoscopically Assisted Vaginal Hysterectomy, Care After The following information offers guidance on how to care for yourself after your procedure. Your health care provider may also give you more specific instructions. If you have problems or questions, contact your health care provider. What can I expect after the procedure? After the procedure, it is common to have:  Soreness and numbness in your incision areas.  Abdominal pain. You will be given pain medicine to control it.  Vaginal bleeding and discharge. You will need to use a sanitary pad after this procedure.  Tiredness (fatigue).  Poor appetite.  Less interest in sex.  Feelings of sadness or other emotions. If your ovaries were also removed, it is common to have symptoms of menopause, such as hot flashes, night sweats, and lack of sleep (insomnia). Follow these instructions at home: Medicines  Take over-the-counter and prescription medicines only as told by your health care provider.  Do not take aspirin or NSAIDs, such as ibuprofen. These medicines can cause bleeding.  Ask your health care provider if the medicine prescribed to you: ? Requires you to avoid driving or using heavy machinery. ? Can cause constipation. You may need to take these actions to prevent or treat constipation:  Drink enough fluid to keep your urine pale yellow.  Take over-the-counter or prescription medicines.  Eat foods that are high in fiber, such as beans, whole grains, and fresh fruits and vegetables.  Limit foods that are high in fat and processed sugars, such as fried or sweet foods. Incision care  Follow instructions from your health care provider about how to take care of your incisions. Make sure you: ? Wash your hands with soap and water for at least 20 seconds before and after you change your bandage (dressing). If soap and water are not available, use hand sanitizer. ? Change your dressing as told by your health care  provider. ? Leave stitches (sutures), skin glue, or adhesive strips in place. These skin closures may need to stay in place for 2 weeks or longer. If adhesive strip edges start to loosen and curl up, you may trim the loose edges. Do not remove adhesive strips completely unless your health care provider tells you to do that.  Check your incision areas every day for signs of infection. Check for: ? More redness, swelling, or pain. ? Fluid or blood. ? Warmth. ? Pus or a bad smell.   Activity  Rest as told by your healthcare provider.  Return to your normal activities as told by your health care provider. Ask your health care provider what activities are safe for you.  Avoid sitting for a long time without moving. Get up to take short walks every 1-2 hours. This is important to improve blood flow and breathing. Ask for help if you feel weak or unsteady.  Do not lift anything that is heavier than 10 lb (4.5 kg), or the limit that you are told, until your health care provider says that it is safe.  If you were given a sedative during the procedure, it can affect you for several hours. Do not drive or operate machinery until your health care provider says that it is safe.   Lifestyle  Do not use any products that contain nicotine or tobacco. These products include cigarettes, chewing tobacco, and vaping devices, such as e-cigarettes. These can delay healing after surgery. If you need help quitting, ask your health care provider.  Do not drink alcohol until your health care provider approves. General  instructions  Do not douche, use tampons, or have sex for at least 6 weeks, or as told by your health care provider.  If you struggle with physical or emotional changes after your procedure, speak with your health care provider or a therapist.  Do not take baths, swim, or use a hot tub until your health care provider approves. You may only be allowed to take showers for 2-3 weeks.  Keep your  dressing dry until your health care provider says it can be removed.  Try to have someone at home with you for the first 1-2 weeks to help with your daily chores.  Wear compression stockings as told by your health care provider. These stockings help to prevent blood clots and reduce swelling in your legs.  Keep all follow-up visits. This is important.   Contact a health care provider if:  You have any of these signs of infection: ? More redness, swelling, warmth, or pain around an incision. ? Fluid or blood coming from an incision. ? Pus or a bad smell coming from an incision. ? Chills or a fever.  An incision opens.  Your pain medicine is not helping.  You feel dizzy or light-headed.  You have pain or bleeding when you urinate.  You have nausea and vomiting that does not go away.  You have pus, or a bad-smelling discharge coming from your vagina. Get help right away if:  You have a fever and your symptoms suddenly get worse.  You have severe abdominal pain.  You have chest pain.  You have shortness of breath.  You faint.  You have pain, swelling, or redness in your leg.  You have heavy vaginal bleeding and blood clots, soaking through a sanitary pad in less than 1 hour. These symptoms may represent a serious problem that is an emergency. Do not wait to see if the symptoms will go away. Get medical help right away. Call your local emergency services (911 in the [place] Do not drive yourself to the hospital. Summary  After the procedure, it is common to have abdominal pain and vaginal bleeding.  Wear a sanitary pad for vaginal discharge or bleeding.  You should not drive or lift heavy objects until your health care provider says that it is safe.  Contact your health care provider if you have any symptoms of infection, heavy vaginal bleeding, nausea, vomiting, or shortness of breath. This information is not intended to replace advice given to you by your health care  provider. Make sure you discuss any questions you have with your health care provider. Document Revised: 12/16/2019 Document Reviewed: 12/16/2019 Elsevier Patient Education  2021 Elsevier Inc.   AMBULATORY SURGERY  DISCHARGE INSTRUCTIONS   1) The drugs that you were given will stay in your system until tomorrow so for the next 24 hours you should not:  A) Drive an automobile B) Make any legal decisions C) Drink any alcoholic beverage   2) You may resume regular meals tomorrow.  Today it is better to start with liquids and gradually work up to solid foods.  You may eat anything you prefer, but it is better to start with liquids, then soup and crackers, and gradually work up to solid foods.   3) Please notify your doctor immediately if you have any unusual bleeding, trouble breathing, redness and pain at the surgery site, drainage, fever, or pain not relieved by medication.  4) Your post-operative visit with Dr.                                       is: Date:                        Time:    Please call to schedule your post-operative visit.  5) Additional Instructions:  Bupivacaine Liposomal Suspension for Injection What is this medicine? BUPIVACAINE LIPOSOMAL (bue PIV a kane LIP oh som al) is an anesthetic. It causes loss of feeling in the skin or other tissues. It is used to prevent and to treat pain from some procedures. This medicine may be used for other purposes; ask your health care provider or pharmacist if you have questions. COMMON BRAND NAME(S): EXPAREL What should I tell my health care provider before I take this medicine? They need to know if you have any of these conditions:  G6PD deficiency  heart disease  kidney disease  liver disease  low blood pressure  lung or breathing disease, like asthma  an unusual or allergic reaction to bupivacaine, other medicines, foods, dyes, or preservatives  pregnant or trying to get pregnant  breast-feeding How  should I use this medicine? This medicine is injected into the affected area. It is given by a health care provider in a hospital or clinic setting. Talk to your health care provider about the use of this medicine in children. While it may be given to children as young as 6 years for selected conditions, precautions do apply. Overdosage: If you think you have taken too much of this medicine contact a poison control center or emergency room at once. NOTE: This medicine is only for you. Do not share this medicine with others. What if I miss a dose? This does not apply. What may interact with this medicine? This medicine may interact with the following medications:  acetaminophen  certain antibiotics like dapsone, nitrofurantoin, aminosalicylic acid, sulfonamides  certain medicines for seizures like phenobarbital, phenytoin, valproic acid  chloroquine  cyclophosphamide  flutamide  hydroxyurea  ifosfamide  metoclopramide  nitric oxide  nitroglycerin  nitroprusside  nitrous oxide  other local anesthetics like lidocaine, pramoxine, tetracaine  primaquine  quinine  rasburicase  sulfasalazine This list may not describe all possible interactions. Give your health care provider a list of all the medicines, herbs, non-prescription drugs, or dietary supplements you use. Also tell them if you smoke, drink alcohol, or use illegal drugs. Some items may interact with your medicine. What should I watch for while using this medicine? Your condition will be monitored carefully while you are receiving this medicine. Be careful to avoid injury while the area is numb, and you are not aware of pain. What side effects may I notice from receiving this medicine? Side effects that you should report to your doctor or health care professional as soon as possible:  allergic reactions like skin rash, itching or hives, swelling of the face, lips, or tongue  seizures  signs and symptoms of a  dangerous change in heartbeat or heart rhythm like chest pain; dizziness; fast, irregular heartbeat; palpitations; feeling faint or lightheaded; falls; breathing problems  signs and symptoms of methemoglobinemia such as pale, gray, or blue colored skin; headache; fast heartbeat; shortness of breath; feeling faint or lightheaded, falls; tiredness Side effects that usually do not require medical attention (report to your doctor or health care professional if they continue or are bothersome):  anxious  back pain  changes in taste  changes in vision  constipation  dizziness  fever  nausea, vomiting This list may not describe all possible side effects. Call your doctor   for medical advice about side effects. You may report side effects to FDA at 1-800-FDA-1088. Where should I keep my medicine? This drug is given in a hospital or clinic and will not be stored at home. NOTE: This sheet is a summary. It may not cover all possible information. If you have questions about this medicine, talk to your doctor, pharmacist, or health care provider.  2021 Elsevier/Gold Standard (2019-07-21 12:24:57)   

## 2020-05-29 NOTE — Interval H&P Note (Signed)
History and Physical Interval Note:  05/29/2020 7:17 AM  Jessica Woods  has presented today for surgery, with the diagnosis of fibroid uterus.  The various methods of treatment have been discussed with the patient and family. After consideration of risks, benefits and other options for treatment, the patient has consented to  Procedure(s): XI ROBOTIC ASSISTED TOTAL HYSTERECTOMY WITH BILATERAL SALPINGO OOPHORECTOMY (Bilateral) INDOCYANINE GREEN FLUORESCENCE IMAGING (ICG) (Bilateral) as a surgical intervention.  The patient's history has been reviewed, patient examined, no change in status, stable for surgery.  I have reviewed the patient's chart and labs.  Questions were answered to the patient's satisfaction.     [place]

## 2020-05-29 NOTE — Telephone Encounter (Signed)
FMLA/DISABILITY form for [facility] filled out, signature obtained, given to [facility] for processing.

## 2020-05-29 NOTE — Anesthesia Preprocedure Evaluation (Signed)
Anesthesia Evaluation  Patient identified by MRN, date of birth, ID band Patient awake    Reviewed: Allergy & Precautions, NPO status , Patient's Chart, lab work & pertinent test results  History of Anesthesia Complications Negative for: history of anesthetic complications  Airway Mallampati: II       Dental   Pulmonary neg sleep apnea, neg COPD, Not current smoker,           Cardiovascular (-) hypertension(-) Past MI and (-) CHF (-) dysrhythmias (-) Valvular Problems/Murmurs     Neuro/Psych neg Seizures Depression    GI/Hepatic Neg liver ROS, neg GERD  ,  Endo/Other  neg diabetes  Renal/GU negative Renal ROS     Musculoskeletal   Abdominal (+) + obese,   Peds  Hematology   Anesthesia Other Findings   Reproductive/Obstetrics                             Anesthesia Physical Anesthesia Plan  ASA: II  Anesthesia Plan: General   Post-op Pain Management:    Induction: Intravenous  PONV Risk Score and Plan: 3 and Ondansetron, Dexamethasone and Midazolam  Airway Management Planned: Oral ETT  Additional Equipment:   Intra-op Plan:   Post-operative Plan:   Informed Consent: I have reviewed the patients History and Physical, chart, labs and discussed the procedure including the risks, benefits and alternatives for the proposed anesthesia with the patient or authorized representative who has indicated his/her understanding and acceptance.       Plan Discussed with:   Anesthesia Plan Comments:         Anesthesia Quick Evaluation

## 2020-05-29 NOTE — Op Note (Signed)
Preoperative diagnosis:  1. Symptomatic leiomyoma  Postoperative diagnosis:  1. Symptomatic leiomyoma  Procedure: 1. Cystoscopy with bilateral ureteral catheterization and injection of ICG  Surgeon: Abbie Sons, MD  Anesthesia: General  Complications: None  EBL: None  Specimens: None  Indication: Jessica Woods is a 50 y.o. female with symptomatic leiomyoma scheduled for robotic assisted total hysterectomy with bilateral BSO with Dr. Gilman Schmidt.  Injection of ureteral ICG has been requested to aid in ureteral localization intraoperatively.  After reviewing the management options for treatment, he elected to proceed with the above surgical procedure(s). We have discussed the potential benefits and risks of the procedure, side effects of the proposed treatment, the likelihood of the patient achieving the goals of the procedure, and any potential problems that might occur during the procedure or recuperation. Informed consent has been obtained.  Description of procedure:  The patient was taken to the operating room and general anesthesia was induced.  The patient was placed in the dorsal lithotomy position, prepped and draped in the usual sterile fashion, and preoperative antibiotics were administered. A preoperative time-out was performed.   A 21 French cystoscope was lubricated and passed per urethra.  Panendoscopy was performed and the bladder mucosa was normal in appearance without erythema, solid or papillary lesions.  There were areas of cystitis follicularis at the bladder neck anteriorly.  Ureteral orifices were normal-appearing with clear efflux.  A 5 French open-ended ureteral catheter was placed through the cystoscope and into the right ureteral orifice to the 15 cm mark and 10 mL of ICG was injected as the catheter was withdrawn.  An identical procedure was performed on the contralateral side.  A 16 French Foley catheter was placed to gravity drainage.  She tolerated  procedure well without complications.  She was then repositioned, prepped and draped for her gynecologic procedure.   Abbie Sons, M.D.

## 2020-05-29 NOTE — OR Nursing (Signed)
Report given to [employer]

## 2020-05-29 NOTE — Op Note (Signed)
Operative Note   PRE-OP DIAGNOSIS: Symptomatic leiomyoma    POST-OP DIAGNOSIS: Symptomatic leiomyoma  SURGEON: Lexine Jaspers MD  ANESTHESIA: General  PROCEDURE: Procedure(s):Robotic assisted total hysterectomy and bilateral salpingectomy with contained manual morcellation within an EndoCatch bag  ESTIMATED BLOOD LOSS: 100 cc  DRAINS: Foley  SPECIMENS:  Uterus, and bilateral fallopian tubes  COMPLICATIONS: None  DISPOSITION: PACU  CONDITION: Stable  INDICATIONS: Symptomatic leiomyoma  FINDINGS: Exam under anesthesia revealed an enlarged 20 week mobile globular and irregular uterus. There were no adnexal masses or nodularity. The parametria was smooth. The cervix was negative for gross lesions. Intraoperative findings included: The uterus was enlarged and enlongated to 20 cm with multiple leiomyoma. The adnexa were normal bilaterally. The upper abdomen was normal including omentum, bowel, liver, stomach, and diaphragmatic surfaces. There was no evidence of grossly enlarged pelvic or right para-aortic lymph nodes.   PROCEDURE IN DETAIL: After informed consent was obtained, the patient was taken to the operating room where anesthesia was obtained without difficulty. The patient was positioned in the dorsal lithotomy position in [place] and her arms were carefully tucked at her sides and the usual precautions were taken.  She was prepped and draped in normal sterile fashion.  Time-out was performed. Dr. Bernardo Heater placed the Foley catheter and bilateral firefly injections as per his operative note. After her portion of the procedure the patient was reprepped and redraped in normal sterile fashion. A speculum was placed in the vagina and the cervical os was dilator. The uterus sounded to 10 cm.  A standard VCare uterine manipulator was then placed in the uterus without incident.    Operative entry was obtained via a supraumbilical incision and direct entry. The Optiview port was  placed, abdomen insuffulated, and pelvis visualized with noted findings above.  The patient was placed in Trendelenburg and the bowel was displaced up into the upper abdomen.  The robotic ports and LUQ port placement, and robotic docking was performed. Bilateral salpingectomy was performed. Round ligaments were divided on each side.  The bilateral utero-ovarian ligaments were sealed and divided.  A bladder flap was created and the bladder was dissected down off the lower uterine segment and cervix. The fibroids uterus was manipulated with the robotic tenaculum and the uterine arteries were skeletonized bilaterally, sealed and divided with the Vesselsealer device or cautery.  A colpotomy was performed circumferentially along the V-Care ring with electrocautery and the cervix was incised from the vagina. The V-care was removed.  The specimen was placed in the upper abdomen.   A pneumo balloon was placed in the vagina and the vaginal cuff was then closed in a running continuous fashion using 0 V-Lock suture with careful attention to include the vaginal cuff angles and the vaginal mucosa within the closure.  Arista was applied to the vaginal cuff.   The specimen was placed in a bag and brought through the umbilical incision. Contained manual morcellations was performed within the laparoscopic bag and removed completely.   Hemostasis was observed.    The trocars were removed and the fascia of the umbilical port was closed with 0 Vicryl suture using running technique.   The skin incision at the umbilicus was closed with subcuticular stitch.  The remaining skin incisions were closed with subcuticular stitch and Indermil glue.    Foley catheter was removed from the bladder. Cystoscopy was performed. Bladder dome was intact and without injury. Urine jets were noted bilaterally from each ureter. The cystoscope was removed and the bladder  was drained.  The patient tolerated the procedure well.  Sponge, lap and  needle counts were correct x2.  The patient was taken to recovery room in excellent condition.  Antibiotics: Given 1st or 2nd generation cephalosporin, Antibiotics given within 1 hour of the start of the procedure, Antibiotics ordered to be discontinued within 24 hours post procedure  VTE prophylaxis: was not ordered perioperatively.   Adrian Prows MD, [facility], [facility] 05/29/2020 1:40 PM

## 2020-05-29 NOTE — Progress Notes (Signed)
Dr Kayleen Memos at bedside for eval of pt nystagmus. Present bilateral.  No other focal deficits.  Face symmetrical.  Grip strength equal.  Pt able to communicate.  Has nystagmus to both right and left. Per dr Kayleen Memos likely from meds in OR, continue to monitor.

## 2020-05-29 NOTE — Anesthesia Procedure Notes (Cosign Needed)
Procedure Name: Intubation Date/Time: 05/29/2020 7:39 AM Performed by: Gunnar Fusi, MD Pre-anesthesia Checklist: Patient identified, Emergency Drugs available, Suction available and Patient being monitored Patient Re-evaluated:Patient Re-evaluated prior to induction Oxygen Delivery Method: Circle system utilized Preoxygenation: Pre-oxygenation with 100% oxygen Induction Type: IV induction Ventilation: Mask ventilation without difficulty Laryngoscope Size: McGraph and 3 Grade View: Grade I Tube type: Oral Tube size: 7.5 mm Number of attempts: 1 Airway Equipment and Method: Stylet Placement Confirmation: ETT inserted through vocal cords under direct vision,  positive ETCO2 and breath sounds checked- equal and bilateral Secured at: 22 cm Tube secured with: Tape Dental Injury: Teeth and Oropharynx as per pre-operative assessment

## 2020-05-30 ENCOUNTER — Encounter: Payer: Self-pay | Admitting: Obstetrics and Gynecology

## 2020-05-31 LAB — SURGICAL PATHOLOGY

## 2020-06-01 ENCOUNTER — Ambulatory Visit (INDEPENDENT_AMBULATORY_CARE_PROVIDER_SITE_OTHER): Payer: BC Managed Care – PPO | Admitting: Obstetrics and Gynecology

## 2020-06-01 ENCOUNTER — Encounter: Payer: Self-pay | Admitting: Obstetrics and Gynecology

## 2020-06-01 ENCOUNTER — Other Ambulatory Visit: Payer: Self-pay

## 2020-06-01 VITALS — BP 120/78 | Ht 61.0 in | Wt 192.4 lb

## 2020-06-01 DIAGNOSIS — Z90710 Acquired absence of both cervix and uterus: Secondary | ICD-10-CM

## 2020-06-01 NOTE — Progress Notes (Signed)
  Postoperative Follow-up Patient presents post op from robotic assisted laparoscopic hysterectomy with bilateral salpingectomy for fibroids, 3 ago.  Subjective: Patient reports some improvement in her preop symptoms. Eating a regular diet without difficulty. Pain is controlled with current analgesics. Medications being used: acetaminophen and ibuprofen (OTC).  Activity: normal activities of daily living. Patient reports she has been passing flatus. Denies BM.  Denies significant vaginal bleeding.   Objective: BP 120/78   Ht 5\' 1"  (1.549 m)   Wt 192 lb 6.4 oz (87.3 kg)   LMP 04/27/2020   BMI 36.35 kg/m  Physical Exam Constitutional:      Appearance: Normal appearance.  Genitourinary:     Vulva normal.  HENT:     Head: Normocephalic and atraumatic.  Eyes:     Extraocular Movements: Extraocular movements intact.     Pupils: Pupils are equal, round, and reactive to light.  Cardiovascular:     Rate and Rhythm: Normal rate.  Pulmonary:     Effort: Pulmonary effort is normal.  Abdominal:     Palpations: Abdomen is soft. There is no mass.     Tenderness: There is abdominal tenderness.     Comments: Incision are clean, dry, and intact Minimal bowel sounds, slight distension  Musculoskeletal:     Cervical back: Normal range of motion.  Neurological:     General: No focal deficit present.     Mental Status: She is alert and oriented to person, place, and time.  Skin:    General: Skin is warm and dry.  Psychiatric:        Mood and Affect: Mood normal.        Behavior: Behavior normal.    Assessment: s/p :  robotic assisted laparoscopic hysterectomy with bilateral salpingectomy  stable  Plan: Patient has done well after surgery with no apparent complications.  I have discussed the post-operative course to date, and the expected progress moving forward.  The patient understands what complications to be concerned about.  I will see the patient in routine follow up, or sooner if  needed.    Activity plan: No heavy lifting. Pelvic rest.  Adrian Prows MD, [place], [facility] 06/01/2020 8:26 AM

## 2020-06-01 NOTE — Progress Notes (Signed)
Post Op

## 2020-06-11 NOTE — Anesthesia Postprocedure Evaluation (Signed)
Anesthesia Post Note  Patient: Jessica Woods  Procedure(s) Performed: XI ROBOTIC ASSISTED TOTAL HYSTERECTOMY WITH BILATERAL SALPINGECTOMY (Bilateral ) CYSTOSCOPY INDOCYANINE GREEN FLUORESCENCE IMAGING (ICG) (Bilateral Ureter)  Patient location during evaluation: PACU Anesthesia Type: General Level of consciousness: awake and alert and oriented Pain management: pain level controlled Vital Signs Assessment: post-procedure vital signs reviewed and stable Respiratory status: spontaneous breathing Cardiovascular status: blood pressure returned to baseline Anesthetic complications: no   No complications documented.   Last Vitals:  Vitals:   05/29/20 1440 05/29/20 1553  BP: (!) 134/91 (!) 143/89  Pulse: 75 88  Resp: 18 16  Temp: 36.4 C 36.8 C  SpO2: 99% 98%    Last Pain:  Vitals:   05/30/20 0926  TempSrc:   PainSc: 3                  Maximus Hoffert

## 2020-06-29 ENCOUNTER — Other Ambulatory Visit: Payer: Self-pay

## 2020-06-29 ENCOUNTER — Encounter: Payer: Self-pay | Admitting: Obstetrics and Gynecology

## 2020-06-29 ENCOUNTER — Ambulatory Visit (INDEPENDENT_AMBULATORY_CARE_PROVIDER_SITE_OTHER): Payer: BC Managed Care – PPO | Admitting: Obstetrics and Gynecology

## 2020-06-29 VITALS — BP 118/70 | Ht 61.0 in | Wt 195.6 lb

## 2020-06-29 DIAGNOSIS — Z90710 Acquired absence of both cervix and uterus: Secondary | ICD-10-CM

## 2020-06-29 NOTE — Progress Notes (Signed)
  Postoperative Follow-up Patient presents post op from  [facility] Robot-assisted Laparoscopic Hysterectomy and Bilateral Salpingectomy  for uterine fibroids , 1 month ago.  Subjective: Patient reports marked improvement in her preop symptoms. Eating a regular diet without difficulty. Pain is controlled without any medications.  Activity: normal activities of daily living. Patient reports additional symptom's since surgery of None.  Objective: BP 118/70   Ht 5\' 1"  (1.549 m)   Wt 195 lb 9.6 oz (88.7 kg)   LMP 04/27/2020   BMI 36.96 kg/m  Physical Exam Constitutional:      Appearance: She is well-developed.  HENT:     Head: Normocephalic and atraumatic.  Neck:     Thyroid: No thyromegaly.  Cardiovascular:     Rate and Rhythm: Normal rate and regular rhythm.     Heart sounds: Normal heart sounds.  Pulmonary:     Effort: Pulmonary effort is normal.     Breath sounds: Normal breath sounds.  Abdominal:     General: Bowel sounds are normal. There is no distension.     Palpations: Abdomen is soft. There is no mass.     Comments: Incisions are clean, dry and intact. Small fragment of suture removed from two right incisions.  Musculoskeletal:     Cervical back: Neck supple.  Neurological:     Mental Status: She is alert and oriented to person, place, and time.  Skin:    General: Skin is warm and dry.  Psychiatric:        Behavior: Behavior normal.        Thought Content: Thought content normal.        Judgment: Judgment normal.  Vitals reviewed.    Assessment: s/p :  [facility] Robot-assisted Laparoscopic Hysterectomy and Bilateral Salpingectomy  stable  Plan: Patient has done well after surgery with no apparent complications.  I have discussed the post-operative course to date, and the expected progress moving forward.  The patient understands what complications to be concerned about.  I will see the patient in routine follow up, or sooner if needed.    Activity plan: No heavy lifting.  Pelvic rest.  Adrian Prows MD, [place], [place] 06/29/2020 8:22 AM

## 2020-08-23 ENCOUNTER — Other Ambulatory Visit: Payer: Self-pay

## 2020-08-23 ENCOUNTER — Encounter: Payer: Self-pay | Admitting: Obstetrics and Gynecology

## 2020-08-23 ENCOUNTER — Ambulatory Visit (INDEPENDENT_AMBULATORY_CARE_PROVIDER_SITE_OTHER): Payer: BC Managed Care – PPO | Admitting: Obstetrics and Gynecology

## 2020-08-23 VITALS — BP 120/80 | Ht 61.0 in | Wt 200.0 lb

## 2020-08-23 DIAGNOSIS — Z90710 Acquired absence of both cervix and uterus: Secondary | ICD-10-CM

## 2020-08-23 NOTE — Progress Notes (Signed)
  Postoperative Follow-up Patient presents post op from  [facility] Robot-assisted Laparoscopic Hysterectomy and Bilateral Salpingoectomy  for  Uterine fibroids , 2 months ago.  Subjective: Patient reports marked improvement in her preop symptoms. Eating a regular diet without difficulty. Pain is controlled without any medications.  Activity: normal activities of daily living. Patient reports additional symptom's since surgery of None.  Objective: BP 120/80   Ht 5\' 1"  (1.549 m)   Wt 200 lb (90.7 kg)   LMP 04/27/2020   BMI 37.79 kg/m  Physical Exam Constitutional:      Appearance: She is well-developed.  Genitourinary:     Genitourinary Comments: External: Normal appearing vulva. No lesions noted.  Speculum examination: Normal appearing vaginal cuff. No blood in the vaginal vault. No discharge.   Bimanual examination:cuff is intact, without pain  HENT:     Head: Normocephalic and atraumatic.  Neck:     Thyroid: No thyromegaly.  Cardiovascular:     Rate and Rhythm: Normal rate and regular rhythm.     Heart sounds: Normal heart sounds.  Pulmonary:     Effort: Pulmonary effort is normal.     Breath sounds: Normal breath sounds.  Abdominal:     General: Bowel sounds are normal. There is no distension.     Palpations: Abdomen is soft. There is no mass.  Musculoskeletal:     Cervical back: Neck supple.  Neurological:     Mental Status: She is alert and oriented to person, place, and time.  Skin:    General: Skin is warm and dry.  Psychiatric:        Behavior: Behavior normal.        Thought Content: Thought content normal.        Judgment: Judgment normal.  Vitals reviewed.   Assessment: s/p :  [facility] Robot-assisted Laparoscopic Hysterectomy and Bilateral Salpingoectomy  stable  Plan: Patient has done well after surgery with no apparent complications.  I have discussed the post-operative course to date, and the expected progress moving forward.  The patient understands what  complications to be concerned about.  I will see the patient in routine follow up, or sooner if needed.    Activity plan: No restriction.   Adrian Prows MD, [facility], [facility] 08/23/2020 8:33 AM

## 2020-08-24 ENCOUNTER — Ambulatory Visit: Payer: BC Managed Care – PPO | Admitting: Obstetrics and Gynecology

## 2021-01-07 ENCOUNTER — Ambulatory Visit: Payer: BC Managed Care – PPO | Admitting: Internal Medicine

## 2021-01-08 DIAGNOSIS — F331 Major depressive disorder, recurrent, moderate: Secondary | ICD-10-CM | POA: Diagnosis not present

## 2021-01-18 DIAGNOSIS — Z23 Encounter for immunization: Secondary | ICD-10-CM | POA: Diagnosis not present

## 2021-07-04 ENCOUNTER — Encounter: Payer: Self-pay | Admitting: Nurse Practitioner

## 2021-07-04 ENCOUNTER — Ambulatory Visit (INDEPENDENT_AMBULATORY_CARE_PROVIDER_SITE_OTHER): Payer: BC Managed Care – PPO | Admitting: Nurse Practitioner

## 2021-07-04 ENCOUNTER — Other Ambulatory Visit: Payer: Self-pay

## 2021-07-04 VITALS — BP 122/80 | HR 99 | Temp 98.1°F | Resp 16 | Wt 205.0 lb

## 2021-07-04 DIAGNOSIS — Z130 Encounter for screening for diseases of the blood and blood-forming organs and certain disorders involving the immune mechanism: Secondary | ICD-10-CM | POA: Diagnosis not present

## 2021-07-04 DIAGNOSIS — Z7689 Persons encountering health services in other specified circumstances: Secondary | ICD-10-CM

## 2021-07-04 DIAGNOSIS — M25562 Pain in left knee: Secondary | ICD-10-CM | POA: Diagnosis not present

## 2021-07-04 DIAGNOSIS — Z13220 Encounter for screening for lipoid disorders: Secondary | ICD-10-CM

## 2021-07-04 DIAGNOSIS — Z1159 Encounter for screening for other viral diseases: Secondary | ICD-10-CM | POA: Diagnosis not present

## 2021-07-04 DIAGNOSIS — Z1211 Encounter for screening for malignant neoplasm of colon: Secondary | ICD-10-CM

## 2021-07-04 DIAGNOSIS — Z114 Encounter for screening for human immunodeficiency virus [HIV]: Secondary | ICD-10-CM

## 2021-07-04 DIAGNOSIS — Z131 Encounter for screening for diabetes mellitus: Secondary | ICD-10-CM

## 2021-07-04 DIAGNOSIS — L710 Perioral dermatitis: Secondary | ICD-10-CM

## 2021-07-04 DIAGNOSIS — M79651 Pain in right thigh: Secondary | ICD-10-CM

## 2021-07-04 DIAGNOSIS — Z1231 Encounter for screening mammogram for malignant neoplasm of breast: Secondary | ICD-10-CM

## 2021-07-04 DIAGNOSIS — M79652 Pain in left thigh: Secondary | ICD-10-CM

## 2021-07-04 NOTE — Progress Notes (Addendum)
BP 122/80    Pulse 99    Temp 98.1 F (36.7 C) (Oral)    Resp 16    Wt 205 lb (93 kg)    LMP 04/27/2020    SpO2 95%    BMI 38.73 kg/m    Subjective:    Patient ID: Jessica Woods, female    DOB: 02-Apr-1971, 51 y.o.   MRN: [id number]  HPI: Jessica Woods is a 51 y.o. female  Chief Complaint  Patient presents with   Establish Care   Rash    Around mouth, lips swelling   Establish care: She is a Licensed conveyancer. Her last physical was many years ago. She has had a hysterectomy due to having fibroids.   Rash: She says she has a rash that shows up around her mouth off and on since January.  She says that she saw her dentist and they recommended a gold bond lotion and she has been using that and the rash has improved. She will continue current treatment.  Pain on back of thighs: She says she started having pain in the back of her thighs over a year ago.  She says she initially thought it was due to her pelvic pain she was having with her fibroids.  She says since the hysterectomy the pain has improved but she still has it.  She says it can be difficult going up the stairs.  Discussed it is likely due to under use of muscle group but will check labs for underlying cause.  She is agreeable to plan.    Left knee pain: She says she has left  knee pain that started about a month ago, no swelling. She denies any injury.  She says that it hurts especially while going up stairs.  Discussed trying Voltaren gel, if no improvement may refer to orthopedics for further care. She is agreeable to plan.   Depression screen [facility] 2/9 07/04/2021 03/07/2020  Decreased Interest 0 1  Down, Depressed, Hopeless 0 1  PHQ - 2 Score 0 2  Altered sleeping 0 0  Tired, decreased energy 1 2  Change in appetite 0 0  Feeling bad or failure about yourself  0 1  Trouble concentrating 0 0  Moving slowly or fidgety/restless 0 0  Suicidal thoughts 0 0  PHQ-9 Score 1 5  Difficult doing work/chores Not difficult at all Not  difficult at all    Relevant past medical, surgical, family and social history reviewed and updated as indicated. Interim medical history since our last visit reviewed. Allergies and medications reviewed and updated.  Review of Systems  Constitutional: Negative for fever or weight change.  Respiratory: Negative for cough and shortness of breath.   Cardiovascular: Negative for chest pain or palpitations.  Gastrointestinal: Negative for abdominal pain, no bowel changes.  Musculoskeletal: Negative for gait problem or joint swelling. Positive for left knee pain and bilateral thigh pain Skin: Positive for rash.  Neurological: Negative for dizziness or headache.  No other specific complaints in a complete review of systems (except as listed in HPI above).      Objective:    BP 122/80    Pulse 99    Temp 98.1 F (36.7 C) (Oral)    Resp 16    Wt 205 lb (93 kg)    LMP 04/27/2020    SpO2 95%    BMI 38.73 kg/m   Wt Readings from Last 3 Encounters:  07/04/21 205 lb (93 kg)  08/23/20 200 lb (90.7 kg)  06/29/20 195 lb 9.6 oz (88.7 kg)    Physical Exam  Constitutional: Patient appears well-developed and well-nourished. Obese  No distress.  HEENT: head atraumatic, normocephalic, pupils equal and reactive to light, neck supple Cardiovascular: Normal rate, regular rhythm and normal heart sounds.  No murmur heard. No BLE edema. Pulmonary/Chest: Effort normal and breath sounds normal. No respiratory distress. Abdominal: Soft.  There is no tenderness Skin: no rash noted today MSK: no decrease ROM, no tenderness or swelling noted Psychiatric: Patient has a normal mood and affect. behavior is normal. Judgment and thought content normal.   Results for orders placed or performed during the hospital encounter of 05/29/20  Pregnancy, urine POC  Result Value Ref Range   Preg Test, Ur NEGATIVE NEGATIVE  ABO/Rh  Result Value Ref Range   ABO/RH(D)      A POS Performed at [facility], [address], [place], [place] [address]   Surgical pathology  Result Value Ref Range   SURGICAL PATHOLOGY      SURGICAL PATHOLOGY CASE: [id number] PATIENT: Jessica Woods Surgical Pathology Report     Specimen Submitted: A. Uterus, cervix, bilateral tubes  Clinical History: Fibroid uterus      DIAGNOSIS: A. UTERUS WITH CERVIX AND BILATERAL FALLOPIAN TUBES; TOTAL HYSTERECTOMY WITH BILATERAL SALPINGECTOMY: - CERVIX:      - NEGATIVE FOR DYSPLASIA AND MALIGNANCY. - ENDOMETRIUM:      - BENIGN ENDOMETRIAL POLYP.      - NEGATIVE FOR ATYPIA / EIN AND MALIGNANCY. - MYOMETRIUM:      - LEIOMYOMATA. - BILATERAL FALLOPIAN TUBES:      - NO SIGNIFICANT PATHOLOGIC ALTERATION.   GROSS DESCRIPTION: A. Labeled: Uterus with cervix and bilateral tubes Received: Formalin Collection time: 11:42 AM on 05/29/2020 Placed into formalin time: 12:46 PM on 05/29/2020 Weight: 856.68 grams Dimensions:      Fundus -aggregate, 26 x 19 x 9.5 cm      Cervix -4.2 x 4.2 cm Serosa: The specimen is received highly disrupted.  The identifiable serosa is tan-pink, smooth, and glistening. Cervix : The cervix is tan, smooth, and pearly with scattered areas of punctate hemorrhage.  The outer cauterized aspect is inked as follows: Presumed anterior = blue, presumed posterior = black. Endocervix: The endocervix is tan, mucoid, and finely granular with a 1 x 0.6 cm slit-like endocervical os. Endometrial cavity:      Dimensions -cannot be determined      Thickness -0.1 cm      Other findings -the identifiable endometrium is tan-brown and finely granular.  1 fragment has a 1.9 x 0.7 x 0.5 cm polyp. Myometrium:     Thickness -at least 2.5 cm     Other findings -the myometrium is tan-pink with multiple intramural nodules and fragments ranging from 0.5 up to potentially 16 cm in greatest dimension.  The fragments have tan-white, whorled, and firm cut surfaces.  2 of the fragments, 1.9 and 5.2 cm in greatest  dimension, have scattered areas of slight yellow discoloration. Adnexa:      Fallopian tube A           Measurements -3.2 fragments cm in length x 0.9 cm in diamet er           Other findings -the fallopian tube is fimbriated with a purple-brown, smooth, and glistening serosa. There is a 0.6 x 0.5 x 0.4 cm paratubal cyst.  The lumen is pinpoint and patent.      Fallopian tube B  Measurements -4.5 cm in length x 0.9 cm in diameter           Other findings -the fallopian tube is fimbriated with a brown-purple, smooth, and glistening serosa.  There is a single paratubal cyst, 0.3 x 0.2 x 0.2 cm.  The lumen is pinpoint and patent. Other comments: None grossly appreciated.  Block summary: 1 - representative cervix/endocervix, presumed 12:00 2 - representative cervix/endocervix, presumed 3:00 3 - representative cervix/endocervix, presumed 6:00 4 - representative cervix/endocervix, presumed 9:00 5 - 6 - representative endomyometrium 7 - endometrial polyp, bisected and submitted entirely 8 - representative smaller discolored nodule (1/cm) 9 - 10 - representative larger discolored nodule (1/cm) 11 - 14 - representative additi onal intramural nodules 15 - fallopian tube A fimbria, longitudinally sectioned and submitted entirely 16 - representative fallopian tube A cross-sections 17 - 18 - fallopian tube B fimbria, longitudinally sectioned and submitted entirely 19 - representative fallopian tube B cross-sections   Final Diagnosis performed by Betsy Pries, MD.   Electronically signed 05/31/2020 2:25:20PM The electronic signature indicates that the named Attending Pathologist has evaluated the specimen Technical component performed at [facility], [address], [place], [place] [address] Lab: [phone] Dir: Rush Farmer, MD, MMM  Professional component performed at [facility], [facility], [place], [place], [place] [address] Lab: [phone] Dir: Dellia Nims. Reuel Derby, MD       Assessment & Plan:   1. Perioral dermatitis -continue with gold bond cream, reach out if rash returns  2. Bilateral thigh pain - COMPLETE METABOLIC PANEL WITH GFR -continue with stretching and increasing physical activity as tolerated  3. Acute pain of left knee -try Voltaren gel, and may use ibuprofen for pain, if no improvement will refer to ortho  4. Encounter to establish care -set up for cpe  5. Screening mammogram, encounter for  - MM Digital Screening; Future  6. Encounter for screening colonoscopy  - Ambulatory referral to Gastroenterology  7. Screening for cholesterol level  - Lipid panel  8. Screening for diabetes mellitus  - COMPLETE METABOLIC PANEL WITH GFR - Hemoglobin A1c  9. Screening for HIV without presence of risk factors  - HIV Antibody (routine testing w rflx)  10. Encounter for hepatitis C screening test for low risk patient  - Hepatitis C antibody  11. Screening for deficiency anemia  - CBC with Differential/Platelet   Follow up plan: Return in about 3 months (around 10/04/2021) for cpe.

## 2021-07-05 ENCOUNTER — Other Ambulatory Visit: Payer: Self-pay

## 2021-07-05 ENCOUNTER — Telehealth: Payer: Self-pay

## 2021-07-05 DIAGNOSIS — Z1211 Encounter for screening for malignant neoplasm of colon: Secondary | ICD-10-CM

## 2021-07-05 LAB — COMPLETE METABOLIC PANEL WITH GFR
AG Ratio: 1.6 (calc) (ref 1.0–2.5)
ALT: 14 U/L (ref 6–29)
AST: 17 U/L (ref 10–35)
Albumin: 4.3 g/dL (ref 3.6–5.1)
Alkaline phosphatase (APISO): 63 U/L (ref 37–153)
BUN/Creatinine Ratio: 14 (calc) (ref 6–22)
BUN: 15 mg/dL (ref 7–25)
CO2: 28 mmol/L (ref 20–32)
Calcium: 10.1 mg/dL (ref 8.6–10.4)
Chloride: 104 mmol/L (ref 98–110)
Creat: 1.04 mg/dL — ABNORMAL HIGH (ref 0.50–1.03)
Globulin: 2.7 g/dL (calc) (ref 1.9–3.7)
Glucose, Bld: 93 mg/dL (ref 65–99)
Potassium: 4.6 mmol/L (ref 3.5–5.3)
Sodium: 141 mmol/L (ref 135–146)
Total Bilirubin: 0.6 mg/dL (ref 0.2–1.2)
Total Protein: 7 g/dL (ref 6.1–8.1)
eGFR: 65 mL/min/{1.73_m2} (ref >=60)

## 2021-07-05 LAB — CBC WITH DIFFERENTIAL/PLATELET
Absolute Monocytes: 338 cells/uL (ref 200–950)
Basophils Absolute: 9 cells/uL (ref 0–200)
Basophils Relative: 0.2 %
Eosinophils Absolute: 38 cells/uL (ref 15–500)
Eosinophils Relative: 0.8 %
HCT: 42.5 % (ref 35.0–45.0)
Hemoglobin: 14.1 g/dL (ref 11.7–15.5)
Lymphs Abs: 964 cells/uL (ref 850–3900)
MCH: 30.3 pg (ref 27.0–33.0)
MCHC: 33.2 g/dL (ref 32.0–36.0)
MCV: 91.2 fL (ref 80.0–100.0)
MPV: 10.4 fL (ref 7.5–12.5)
Monocytes Relative: 7.2 %
Neutro Abs: 3351 cells/uL (ref 1500–7800)
Neutrophils Relative %: 71.3 %
Platelets: 212 10*3/uL (ref 140–400)
RBC: 4.66 10*6/uL (ref 3.80–5.10)
RDW: 12.3 % (ref 11.0–15.0)
Total Lymphocyte: 20.5 %
WBC: 4.7 10*3/uL (ref 3.8–10.8)

## 2021-07-05 LAB — HIV ANTIBODY (ROUTINE TESTING W REFLEX): HIV 1&2 Ab, 4th Generation: NONREACTIVE

## 2021-07-05 LAB — HEMOGLOBIN A1C
Hgb A1c MFr Bld: 5.7 % of total Hgb — ABNORMAL HIGH (ref <5.7)
Mean Plasma Glucose: 117 mg/dL
eAG (mmol/L): 6.5 mmol/L

## 2021-07-05 LAB — HEPATITIS C ANTIBODY
Hepatitis C Ab: NONREACTIVE
SIGNAL TO CUT-OFF: 0.02 (ref <1.00)

## 2021-07-05 LAB — LIPID PANEL
Cholesterol: 185 mg/dL (ref <200)
HDL: 61 mg/dL (ref >=50)
LDL Cholesterol (Calc): 108 mg/dL (calc) — ABNORMAL HIGH
Non-HDL Cholesterol (Calc): 124 mg/dL (calc) (ref <130)
Total CHOL/HDL Ratio: 3 (calc) (ref <5.0)
Triglycerides: 69 mg/dL (ref <150)

## 2021-07-05 MED ORDER — NA SULFATE-K SULFATE-MG SULF 17.5-3.13-1.6 GM/177ML PO SOLN: 1.0000 | Freq: Once | ORAL | 0 refills | Status: AC

## 2021-07-05 NOTE — Telephone Encounter (Signed)
CALLED PATIENT NO ANSWER LEFT VOICEMAIL FOR A CALL BACK °Letter sent °

## 2021-07-05 NOTE — Progress Notes (Signed)
Gastroenterology Pre-Procedure Review ? ?Request Date: 09/20/2021 ?Requesting Physician: Dr. Marius Ditch ? ?PATIENT REVIEW QUESTIONS: The patient responded to the following health history questions as indicated:   ? ?1. Are you having any GI issues? no ?2. Do you have a personal history of Polyps? no ?3. Do you have a family history of Colon Cancer or Polyps? no ?4. Diabetes Mellitus? no ?5. Joint replacements in the past 12 months?no ?6. Major health problems in the past 3 months?no ?7. Any artificial heart valves, MVP, or defibrillator?no ?   ?MEDICATIONS & ALLERGIES:    ?Patient reports the following regarding taking any anticoagulation/antiplatelet therapy:   ?Plavix, Coumadin, Eliquis, Xarelto, Lovenox, Pradaxa, Brilinta, or Effient? no ?Aspirin? no ? ?Patient confirms/reports the following medications:  ?Current Outpatient Medications  ?Medication Sig Dispense Refill  ? metoCLOPramide (REGLAN) 10 MG tablet Take 1 tablet (10 mg total) by mouth 4 (four) times daily -  before meals and at bedtime for 8 days. 30 tablet 0  ? ?No current facility-administered medications for this visit.  ? ? ?Patient confirms/reports the following allergies:  ?Allergies  ?Allergen Reactions  ? Mushroom Extract Complex Nausea And Vomiting  ?  Abdominal pain  ? Other Nausea And Vomiting  ?  Honey - nausea and abdominal pain   ? Spinach Diarrhea  ?  Abdominal pain  ? ? ?No orders of the defined types were placed in this encounter. ? ? ?AUTHORIZATION INFORMATION ?Primary Insurance: ?1D#: ?Group #: ? ?Secondary Insurance: ?1D#: ?Group #: ? ?SCHEDULE INFORMATION: ?Date: 09/20/2021 ?Time: ?Location:[facility] ? ?

## 2021-07-12 ENCOUNTER — Other Ambulatory Visit: Payer: Self-pay | Admitting: Nurse Practitioner

## 2021-07-12 DIAGNOSIS — Z1231 Encounter for screening mammogram for malignant neoplasm of breast: Secondary | ICD-10-CM

## 2021-07-24 ENCOUNTER — Other Ambulatory Visit: Payer: Self-pay

## 2021-07-24 ENCOUNTER — Ambulatory Visit
Admission: RE | Admit: 2021-07-24 | Discharge: 2021-07-24 | Disposition: A | Discharge status: Home / Self Care | Payer: BC Managed Care – PPO | Source: Ambulatory Visit | Attending: Nurse Practitioner | Admitting: Nurse Practitioner

## 2021-07-24 DIAGNOSIS — Z1231 Encounter for screening mammogram for malignant neoplasm of breast: Secondary | ICD-10-CM | POA: Insufficient documentation

## 2021-08-26 ENCOUNTER — Ambulatory Visit: Payer: BC Managed Care – PPO | Admitting: Nurse Practitioner

## 2021-09-02 ENCOUNTER — Telehealth: Payer: Self-pay | Admitting: Gastroenterology

## 2021-09-02 ENCOUNTER — Telehealth: Payer: Self-pay

## 2021-09-02 NOTE — Telephone Encounter (Signed)
PT left message to cancel appointment for 05/26 and will call back and reschedule ?

## 2021-09-02 NOTE — Telephone Encounter (Signed)
Patient called to reschedule till 09/27/2021 ?Called endo and sent new letters and new referral ?

## 2021-09-26 ENCOUNTER — Encounter: Payer: Self-pay | Admitting: Gastroenterology

## 2021-09-27 ENCOUNTER — Ambulatory Visit: Payer: BC Managed Care – PPO | Admitting: Anesthesiology

## 2021-09-27 ENCOUNTER — Ambulatory Visit
Admission: RE | Admit: 2021-09-27 | Discharge: 2021-09-27 | Disposition: A | Discharge status: Home / Self Care | Payer: BC Managed Care – PPO | Attending: Gastroenterology | Admitting: Gastroenterology

## 2021-09-27 ENCOUNTER — Encounter: Payer: Self-pay | Admitting: Gastroenterology

## 2021-09-27 ENCOUNTER — Encounter
Admission: RE | Disposition: A | Discharge status: Home / Self Care | Payer: Self-pay | Source: Home / Self Care | Attending: Gastroenterology

## 2021-09-27 DIAGNOSIS — Z1211 Encounter for screening for malignant neoplasm of colon: Secondary | ICD-10-CM | POA: Diagnosis not present

## 2021-09-27 DIAGNOSIS — K635 Polyp of colon: Secondary | ICD-10-CM | POA: Diagnosis not present

## 2021-09-27 DIAGNOSIS — K644 Residual hemorrhoidal skin tags: Secondary | ICD-10-CM | POA: Diagnosis not present

## 2021-09-27 HISTORY — PX: COLONOSCOPY WITH PROPOFOL: SHX5780

## 2021-09-27 SURGERY — COLONOSCOPY WITH PROPOFOL
Anesthesia: General

## 2021-09-27 MED ORDER — LIDOCAINE HCL (CARDIAC) PF 100 MG/5ML IV SOSY
PREFILLED_SYRINGE | INTRAVENOUS | Status: DC | PRN
  Administered 2021-09-27: 50 mg via INTRAVENOUS

## 2021-09-27 MED ORDER — PROPOFOL 10 MG/ML IV BOLUS
INTRAVENOUS | Status: AC
  Filled 2021-09-27: qty 20

## 2021-09-27 MED ORDER — PROPOFOL 10 MG/ML IV BOLUS
INTRAVENOUS | Status: DC | PRN
  Administered 2021-09-27: 100 mg via INTRAVENOUS
  Administered 2021-09-27: 40 mg via INTRAVENOUS

## 2021-09-27 MED ORDER — SODIUM CHLORIDE 0.9 % IV SOLN
INTRAVENOUS | Status: DC
  Administered 2021-09-27: 1000 mL via INTRAVENOUS

## 2021-09-27 MED ORDER — PROPOFOL 500 MG/50ML IV EMUL
INTRAVENOUS | Status: DC | PRN
  Administered 2021-09-27: 130 ug/kg/min via INTRAVENOUS

## 2021-09-27 MED ORDER — PROPOFOL 500 MG/50ML IV EMUL
INTRAVENOUS | Status: AC
  Filled 2021-09-27: qty 50

## 2021-09-27 NOTE — Op Note (Signed)
[facility] Patient Name: Jessica Woods Procedure Date: 09/27/2021 9:16 AM MRN: [id number] Account #: [id number] Date of Birth: 1970/09/08 Admit Type: Outpatient Age: 51 Room: [facility] ENDO ROOM 2 Gender: Female Note Status: Finalized Instrument Name: Jasper Riling [id number] Procedure:             Colonoscopy Indications:           Screening for colorectal malignant neoplasm, This is                         the patient's first colonoscopy Providers:             Lin Landsman MD, MD Referring MD:          Serafina Royals, NP Medicines:             General Anesthesia Complications:         No immediate complications. Estimated blood loss:                         Minimal. Procedure:             Pre-Anesthesia Assessment:                        - Prior to the procedure, a History and Physical was                         performed, and patient medications and allergies were                         reviewed. The patient is competent. The risks and                         benefits of the procedure and the sedation options and                         risks were discussed with the patient. All questions                         were answered and informed consent was obtained.                         Patient identification and proposed procedure were                         verified by the physician, the nurse, the                         anesthesiologist, the anesthetist and the technician                         in the pre-procedure area in the procedure room in the                         endoscopy suite. Mental Status Examination: alert and                         oriented. Airway Examination: normal oropharyngeal  airway and neck mobility. Respiratory Examination:                         clear to auscultation. CV Examination: normal.                         Prophylactic Antibiotics: The patient does not require                          prophylactic antibiotics. Prior Anticoagulants: The                         patient has taken no previous anticoagulant or                         antiplatelet agents. ASA Grade Assessment: II - A                         patient with mild systemic disease. After reviewing                         the risks and benefits, the patient was deemed in                         satisfactory condition to undergo the procedure. The                         anesthesia plan was to use general anesthesia.                         Immediately prior to administration of medications,                         the patient was re-assessed for adequacy to receive                         sedatives. The heart rate, respiratory rate, oxygen                         saturations, blood pressure, adequacy of pulmonary                         ventilation, and response to care were monitored                         throughout the procedure. The physical status of the                         patient was re-assessed after the procedure.                        After obtaining informed consent, the colonoscope was                         passed under direct vision. Throughout the procedure,                         the patient's blood pressure, pulse, and oxygen  saturations were monitored continuously. The                         Colonoscope was introduced through the anus and                         advanced to the the cecum, identified by appendiceal                         orifice and ileocecal valve. The colonoscopy was                         performed without difficulty. The patient tolerated                         the procedure well. The quality of the bowel                         preparation was evaluated using the BBPS [facility] Bowel                         Preparation Scale) with scores of: Right Colon = 3,                         Transverse Colon = 3 and Left Colon = 3 (entire mucosa                          seen well with no residual staining, small fragments                         of stool or opaque liquid). The total BBPS score                         equals 9. Findings:      The perianal and digital rectal examinations were normal. Pertinent       negatives include normal sphincter tone and no palpable rectal lesions.      A 5 mm polyp was found in the distal transverse colon. The polyp was       sessile. The polyp was removed with a cold snare. Resection and       retrieval were complete. Estimated blood loss was minimal.      The retroflexed view of the distal rectum and anal verge was normal and       showed no anal or rectal abnormalities.      Skin tags were found on perianal exam. Impression:            - One 5 mm polyp in the distal transverse colon,                         removed with a cold snare. Resected and retrieved.                        - The distal rectum and anal verge are normal on                         retroflexion view.                        -  Perianal skin tags found on perianal exam. Recommendation:        - Discharge patient to home (with escort).                        - Resume previous diet today.                        - Continue present medications.                        - Await pathology results.                        - Repeat colonoscopy in 5 to 7 years for surveillance                         based on pathology results. Procedure Code(s):     --- Professional ---                        [phone], Colonoscopy, flexible; with removal of                         tumor(s), polyp(s), or other lesion(s) by snare                         technique Diagnosis Code(s):     --- Professional ---                        Z12.11, Encounter for screening for malignant neoplasm                         of colon                        K63.5, Polyp of colon                        K64.4, Residual hemorrhoidal skin tags CPT copyright 2019 American Medical  Association. All rights reserved. The codes documented in this report are preliminary and upon coder review may  be revised to meet current compliance requirements. Dr. Ulyess Mort Lin Landsman MD, MD 09/27/2021 9:47:53 AM This report has been signed electronically. Number of Addenda: 0 Note Initiated On: 09/27/2021 9:16 AM Scope Withdrawal Time: 0 hours 9 minutes 31 seconds  Total Procedure Duration: 0 hours 14 minutes 52 seconds  Estimated Blood Loss:  Estimated blood loss was minimal.      [facility]

## 2021-09-27 NOTE — Transfer of Care (Signed)
Immediate Anesthesia Transfer of Care Note  Patient: Jessica Woods  Procedure(s) Performed: COLONOSCOPY WITH PROPOFOL  Patient Location: PACU and Endoscopy Unit  Anesthesia Type:General  Level of Consciousness: drowsy and patient cooperative  Airway & Oxygen Therapy: Patient Spontanous Breathing  Post-op Assessment: Report given to RN and Post -op Vital signs reviewed and stable  Post vital signs: Reviewed and stable  Last Vitals:  Vitals Value Taken Time  BP 99/77 09/27/21 0949  Temp    Pulse 93 09/27/21 0952  Resp 15 09/27/21 0952  SpO2 100 % 09/27/21 0952  Vitals shown include unvalidated device data.  Last Pain:  Vitals:   09/27/21 0949  TempSrc:   PainSc: 0-No pain         Complications: No notable events documented.

## 2021-09-27 NOTE — Anesthesia Postprocedure Evaluation (Signed)
Anesthesia Post Note  Patient: Jessica Woods  Procedure(s) Performed: COLONOSCOPY WITH PROPOFOL  Patient location during evaluation: Endoscopy Anesthesia Type: General Level of consciousness: awake and alert Pain management: pain level controlled Vital Signs Assessment: post-procedure vital signs reviewed and stable Respiratory status: spontaneous breathing, nonlabored ventilation and respiratory function stable Cardiovascular status: blood pressure returned to baseline and stable Postop Assessment: no apparent nausea or vomiting Anesthetic complications: no   No notable events documented.   Last Vitals:  Vitals:   09/27/21 1009 09/27/21 1019  BP: (!) 140/96 (!) 146/99  Pulse: 74 75  Resp: 12 10  Temp:    SpO2: 100% 100%    Last Pain:  Vitals:   09/27/21 1019  TempSrc:   PainSc: 0-No pain                 [place] Ouch

## 2021-09-27 NOTE — H&P (Signed)
  Cephas Darby, MD [address]  [place]  [place], [place] [address]  Main: [phone]  Fax: [phone] Pager: [phone]  Primary Care Physician:  Bo Merino, FNP Primary Gastroenterologist:  Dr. Cephas Darby  Pre-Procedure History & Physical: HPI:  Jessica Woods is a 51 y.o. female is here for an colonoscopy.   Past Medical History:  Diagnosis Date   Depression    5 YRS AGO   Fibroid     Past Surgical History:  Procedure Laterality Date   ABDOMINAL HYSTERECTOMY     CYSTOSCOPY  05/29/2020   Procedure: CYSTOSCOPY;  Surgeon: Homero Fellers, MD;  Location: [facility];  Service: Gynecology;;   ROBOTIC ASSISTED TOTAL HYSTERECTOMY WITH BILATERAL SALPINGO OOPHERECTOMY Bilateral 05/29/2020   Procedure: XI ROBOTIC ASSISTED TOTAL HYSTERECTOMY WITH BILATERAL SALPINGECTOMY;  Surgeon: Homero Fellers, MD;  Location: [facility];  Service: Gynecology;  Laterality: Bilateral;    Prior to Admission medications   Not on File    Allergies as of 07/05/2021 - Review Complete 07/05/2021  Allergen Reaction Noted   Mushroom extract complex Nausea And Vomiting 05/16/2020   Other Nausea And Vomiting 05/16/2020   Spinach Diarrhea 05/16/2020    Family History  Problem Relation Age of Onset   Hypertension Mother    COPD Mother    Heart failure Mother    Hyperlipidemia Mother    Heart disease Father    Heart failure Father    Diabetes Maternal Grandmother     Social History   Socioeconomic History   Marital status: Married    Spouse name: Not on file   Number of children: 0   Years of education: Not on file   Highest education level: Not on file  Occupational History   Not on file  Tobacco Use   Smoking status: Never   Smokeless tobacco: Never  Vaping Use   Vaping Use: Never used  Substance and Sexual Activity   Alcohol use: Never   Drug use: Never   Sexual activity: Not Currently  Other Topics Concern   Not on file  Social History Narrative    Not on file   Social Determinants of Health   Financial Resource Strain: Not on file  Food Insecurity: Not on file  Transportation Needs: Not on file  Physical Activity: Not on file  Stress: Not on file  Social Connections: Not on file  Intimate Partner Violence: Not on file    Review of Systems: See HPI, otherwise negative ROS  Physical Exam: BP 135/89   Temp (!) 97 F (36.1 C) (Axillary)   Resp 16   Ht '5\' 1"'$  (1.549 m)   Wt 86.4 kg   LMP 04/27/2020   SpO2 100%   BMI 35.97 kg/m  General:   Alert,  pleasant and cooperative in NAD Head:  Normocephalic and atraumatic. Neck:  Supple; no masses or thyromegaly. Lungs:  Clear throughout to auscultation.    Heart:  Regular rate and rhythm. Abdomen:  Soft, nontender and nondistended. Normal bowel sounds, without guarding, and without rebound.   Neurologic:  Alert and  oriented x4;  grossly normal neurologically.  Impression/Plan: Jessica Woods is here for an colonoscopy to be performed for colon cancer screening  Risks, benefits, limitations, and alternatives regarding  colonoscopy have been reviewed with the patient.  Questions have been answered.  All parties agreeable.   Sherri Sear, MD  09/27/2021, 9:15 AM

## 2021-09-27 NOTE — Anesthesia Preprocedure Evaluation (Addendum)
Anesthesia Evaluation  Patient identified by MRN, date of birth, ID band Patient awake    Reviewed: Allergy & Precautions, NPO status , Patient's Chart, lab work & pertinent test results  History of Anesthesia Complications Negative for: history of anesthetic complications  Airway Mallampati: III   Neck ROM: Full    Dental no notable dental hx.    Pulmonary neg sleep apnea, neg COPD, Not current smoker,           Cardiovascular (-) hypertension(-) Past MI and (-) CHF Normal cardiovascular exam(-) dysrhythmias (-) Valvular Problems/Murmurs     Neuro/Psych neg Seizures negative psych ROS   GI/Hepatic Neg liver ROS, neg GERD  ,  Endo/Other  neg diabetes  Renal/GU negative Renal ROS     Musculoskeletal   Abdominal (+) + obese,   Peds  Hematology   Anesthesia Other Findings   Reproductive/Obstetrics                            Anesthesia Physical  Anesthesia Plan  ASA: II  Anesthesia Plan: General   Post-op Pain Management: Minimal or no pain anticipated   Induction: Intravenous  PONV Risk Score and Plan: 3 and Propofol infusion, TIVA and Treatment may vary due to age or medical condition  Airway Management Planned: Natural Airway and Nasal Cannula  Additional Equipment:   Intra-op Plan:   Post-operative Plan:   Informed Consent: I have reviewed the patients History and Physical, chart, labs and discussed the procedure including the risks, benefits and alternatives for the proposed anesthesia with the patient or authorized representative who has indicated his/her understanding and acceptance.     Dental advisory given  Plan Discussed with: CRNA and Anesthesiologist  Anesthesia Plan Comments:        Anesthesia Quick Evaluation

## 2021-09-28 ENCOUNTER — Encounter: Payer: Self-pay | Admitting: Gastroenterology

## 2021-10-01 ENCOUNTER — Encounter: Payer: Self-pay | Admitting: Gastroenterology

## 2021-10-01 LAB — SURGICAL PATHOLOGY

## 2021-10-16 ENCOUNTER — Encounter: Payer: Self-pay | Admitting: Family Medicine

## 2021-10-16 ENCOUNTER — Other Ambulatory Visit: Payer: Self-pay

## 2021-10-16 ENCOUNTER — Ambulatory Visit (INDEPENDENT_AMBULATORY_CARE_PROVIDER_SITE_OTHER): Payer: BC Managed Care – PPO | Admitting: Family Medicine

## 2021-10-16 VITALS — BP 120/74 | HR 98 | Temp 98.6°F | Resp 16 | Ht 61.0 in | Wt 211.8 lb

## 2021-10-16 DIAGNOSIS — Z90710 Acquired absence of both cervix and uterus: Secondary | ICD-10-CM | POA: Diagnosis not present

## 2021-10-16 DIAGNOSIS — Z9079 Acquired absence of other genital organ(s): Secondary | ICD-10-CM

## 2021-10-16 DIAGNOSIS — Z90722 Acquired absence of ovaries, bilateral: Secondary | ICD-10-CM | POA: Diagnosis not present

## 2021-10-16 DIAGNOSIS — Z6841 Body Mass Index (BMI) 40.0 and over, adult: Secondary | ICD-10-CM | POA: Diagnosis not present

## 2021-10-16 DIAGNOSIS — Z23 Encounter for immunization: Secondary | ICD-10-CM

## 2021-10-16 NOTE — Patient Instructions (Signed)
Things to do to keep yourself healthy  - Exercise at least 30-45 minutes a day, 3-4 days a week.  - Eat a low-fat diet with lots of fruits and vegetables, up to 7-9 servings per day.  - Seatbelts can save your life. Wear them always.  - Smoke detectors on every level of your home, check batteries every year.  - Eye Doctor - have an eye exam every 1-2 years  - Safe sex - if you may be exposed to STDs, use a condom.  - Alcohol -  If you drink, do it moderately, less than 2 drinks per day.  - Health Care Power of Attorney. Choose someone to speak for you if you are not able. https://www.prepareforyourcare.org is a great website to help you navigate this. - Depression is common in our stressful world.If you're feeling down or losing interest in things you normally enjoy, please come in for a visit.  - Violence - If anyone is threatening or hurting you, please call immediately.  Here is an example of what a healthy plate looks like:    ? Make half your plate fruits and vegetables.     ? Focus on whole fruits.     ? Vary your veggies.  ? Make half your grains whole grains. -     ? Look for the word "whole" at the beginning of the ingredients list    ? Some whole-grain ingredients include whole oats, whole-wheat flour,        whole-grain corn, whole-grain brown rice, and whole rye.  ? Move to low-fat and fat-free milk or yogurt.  ? Vary your protein routine. - Meat, fish, poultry (chicken, turkey), eggs, beans (kidney, pinto), dairy.  ? Drink and eat less sodium, saturated fat, and added sugars.  Look for opportunities to move your body throughout your day:  Never lie down when you can sit; never sit when you can stand; never stand when you can pace.  Moving your body throughout the day is just as important as the 30 or 60 minutes of exercise at the gym!  Get social Get active with your friends instead of going out to eat. Go for a hike, walk around the mall, or play an exercise-themed  video game.   Move more at work Fit more activity into the workday. Stand during phone calls, use a printer farther from your desk, and get up to stretch each hour.    Do something new Develop a new skill to kick-start your motivation. Sign up for a class to learn how to salsa dance, surf, do tai chi, or play a sport.    Keep cool in the pool Don't like to sweat? Hit the local community pool for a swim, water polo, or water aerobics class to stay cool while exercising.    Stay on track Use a fitness tracker (FITBIT, Fitness Pal mobile app) to track your activity and provide motivation to reach your goals.   

## 2021-10-16 NOTE — Progress Notes (Signed)
BP 120/74   Pulse 98   Temp 98.6 F (37 C) (Oral)   Resp 16   Ht '5\' 1"'$  (1.549 m)   Wt 211 lb 12.8 oz (96.1 kg)   LMP 04/27/2020   SpO2 98%   BMI 40.02 kg/m    Subjective:    Patient ID: Jessica Woods, female    DOB: [birth year], 51 y.o.   MRN: [id number]  HPI: Jessica Woods is a 51 y.o. female presenting on 10/16/2021 for comprehensive medical examination. Current medical complaints include:none  She currently lives with: husband, dogs Menopausal Symptoms: no  Depression Screen done today and results listed below:     10/16/2021    9:15 AM 07/04/2021    9:20 AM 03/07/2020    2:07 PM  Depression screen PHQ 2/9  Decreased Interest 1 0 1  Down, Depressed, Hopeless 1 0 1  PHQ - 2 Score 2 0 2  Altered sleeping 0 0 0  Tired, decreased energy '1 1 2  '$ Change in appetite 0 0 0  Feeling bad or failure about yourself  0 0 1  Trouble concentrating 0 0 0  Moving slowly or fidgety/restless 0 0 0  Suicidal thoughts 0 0 0  PHQ-9 Score '3 1 5  '$ Difficult doing work/chores Not difficult at all Not difficult at all Not difficult at all    The patient does not have a history of falls. I did not complete a risk assessment for falls. A plan of care for falls was not documented.   Past Medical History:  Past Medical History:  Diagnosis Date   Depression    5 YRS AGO   Fibroid    Intramural and subserous leiomyoma of uterus    Menorrhagia with regular cycle     Surgical History:  Past Surgical History:  Procedure Laterality Date   ABDOMINAL HYSTERECTOMY     COLONOSCOPY WITH PROPOFOL N/A 09/27/2021   Procedure: COLONOSCOPY WITH PROPOFOL;  Surgeon: Lin Landsman, MD;  Location: [facility];  Service: Gastroenterology;  Laterality: N/A;   CYSTOSCOPY  05/29/2020   Procedure: CYSTOSCOPY;  Surgeon: Homero Fellers, MD;  Location: [facility];  Service: Gynecology;;   ROBOTIC ASSISTED TOTAL HYSTERECTOMY WITH BILATERAL SALPINGO OOPHERECTOMY Bilateral 05/29/2020   Procedure:  XI ROBOTIC ASSISTED TOTAL HYSTERECTOMY WITH BILATERAL SALPINGECTOMY;  Surgeon: Homero Fellers, MD;  Location: [facility];  Service: Gynecology;  Laterality: Bilateral;    Medications:  No current outpatient medications on file prior to visit.   No current facility-administered medications on file prior to visit.    Allergies:  Allergies  Allergen Reactions   Mushroom Extract Complex Nausea And Vomiting    Abdominal pain   Other Nausea And Vomiting    Honey - nausea and abdominal pain    Spinach Diarrhea    Abdominal pain    Social History:  Social History   Socioeconomic History   Marital status: Married    Spouse name: Not on file   Number of children: 0   Years of education: Not on file   Highest education level: Not on file  Occupational History   Not on file  Tobacco Use   Smoking status: Never   Smokeless tobacco: Never  Vaping Use   Vaping Use: Never used  Substance and Sexual Activity   Alcohol use: Never   Drug use: Never   Sexual activity: Not Currently  Other Topics Concern   Not on file  Social History Narrative   Not on file  Social Determinants of Health   Financial Resource Strain: Low Risk  (10/16/2021)   Overall Financial Resource Strain (CARDIA)    Difficulty of Paying Living Expenses: Not hard at all  Food Insecurity: No Food Insecurity (10/16/2021)   Hunger Vital Sign    Worried About Running Out of Food in the Last Year: Never true    Ran Out of Food in the Last Year: Never true  Transportation Needs: No Transportation Needs (10/16/2021)   PRAPARE - Hydrologist (Medical): No    Lack of Transportation (Non-Medical): No  Physical Activity: Insufficiently Active (10/16/2021)   Exercise Vital Sign    Days of Exercise per Week: 1 day    Minutes of Exercise per Session: 10 min  Stress: Stress Concern Present (10/16/2021)   [place]    Feeling  of Stress : To some extent  Social Connections: Socially Integrated (10/16/2021)   Social Connection and Isolation Panel [NHANES]    Frequency of Communication with Friends and Family: More than three times a week    Frequency of Social Gatherings with Friends and Family: Twice a week    Attends Religious Services: 1 to 4 times per year    Active Member of [employer] or Organizations: Yes    Attends Archivist Meetings: 1 to 4 times per year    Marital Status: Married  Human resources officer Violence: Not At Risk (10/16/2021)   Humiliation, Afraid, Rape, and Kick questionnaire    Fear of Current or Ex-Partner: No    Emotionally Abused: No    Physically Abused: No    Sexually Abused: No   Social History   Tobacco Use  Smoking Status Never  Smokeless Tobacco Never   Social History   Substance and Sexual Activity  Alcohol Use Never    Family History:  Family History  Problem Relation Age of Onset   Hypertension Mother    COPD Mother    Heart failure Mother    Hyperlipidemia Mother    Heart disease Father    Heart failure Father    Diabetes Maternal Grandmother     Past medical history, surgical history, medications, allergies, family history and social history reviewed with patient today and changes made to appropriate areas of the chart.      Objective:    BP 120/74   Pulse 98   Temp 98.6 F (37 C) (Oral)   Resp 16   Ht '5\' 1"'$  (1.549 m)   Wt 211 lb 12.8 oz (96.1 kg)   LMP 04/27/2020   SpO2 98%   BMI 40.02 kg/m   Wt Readings from Last 3 Encounters:  10/16/21 211 lb 12.8 oz (96.1 kg)  09/27/21 190 lb 6.2 oz (86.4 kg)  07/04/21 205 lb (93 kg)    Physical Exam Constitutional:      General: She is not in acute distress.    Appearance: She is not ill-appearing.  HENT:     Head: Normocephalic.     Right Ear: External ear normal.     Left Ear: External ear normal.     Nose: Nose normal.  Eyes:     Extraocular Movements: Extraocular movements intact.   Cardiovascular:     Rate and Rhythm: Normal rate and regular rhythm.     Heart sounds: Normal heart sounds. No murmur heard. Pulmonary:     Effort: Pulmonary effort is normal.     Breath sounds: Normal breath  sounds.  Abdominal:     General: Bowel sounds are normal.     Palpations: Abdomen is soft.     Tenderness: There is no abdominal tenderness.  Musculoskeletal:        General: Normal range of motion.     Cervical back: Normal range of motion.     Right lower leg: No edema.     Left lower leg: No edema.  Lymphadenopathy:     Cervical: No cervical adenopathy.  Skin:    General: Skin is warm and dry.  Neurological:     Mental Status: She is alert and oriented to person, place, and time. Mental status is at baseline.     Gait: Gait normal.  Psychiatric:        Mood and Affect: Mood normal.        Behavior: Behavior normal.     Results for orders placed or performed during the hospital encounter of 09/27/21  Surgical pathology  Result Value Ref Range   SURGICAL PATHOLOGY      SURGICAL PATHOLOGY CASE: [phone] [phone] PATIENT: Jessica Woods Surgical Pathology Report     Specimen Submitted: A. Colon polyp, transverse; cold snare  Clinical History: Colon cancer screening Z12.11.  Colon polyps    DIAGNOSIS: A. COLON POLYP, TRANSVERSE; COLD SNARE: - POLYPOID FRAGMENTS OF BENIGN COLONIC MUCOSA WITH SUBMUCOSAL ADIPOSE TISSUE AND SMALL LYMPHOID AGGREGATE. - NEGATIVE FOR DYSPLASIA AND MALIGNANCY.  Comment: The histologic findings may be suggestive of a submucosal lipoma. Multiple additional deeper recut levels were examined.  GROSS DESCRIPTION: A. Labeled: Cold snare polyp transverse colon Received: Formalin Collection time: 9:32 AM on 09/27/2021 Placed into formalin time: 9:32 AM on 09/27/2021 Tissue fragment(s): 2 Size: Ranges from 0.3-0.5 cm Description: Tan soft tissue fragments Entirely submitted in 1 cassette.  CM 09/27/2021  Final Diagnosis performed by  Allena Napoleon, MD.   Electronically signed 10/01/2021 10:12:45AM The electronic  signature indicates that the named Attending Pathologist has evaluated the specimen Technical component performed at [facility], [address] [place], [place] [address] Lab: [phone] Dir: Rush Farmer, MD, MMM  Professional component performed at [facility], [facility], [place], [place], [place] [address] Lab: [phone] Dir: Kathi Simpers, MD       Assessment & Plan:   Problem List Items Addressed This Visit       Other   Morbid obesity with BMI of 40.0-44.9, adult ([place])    No complications other than mildly elevated LDL and A1c, not yet in prediabetic range. Discussed lifestyle changes including diet and exercise.       S/P TAH-BSO (total abdominal hysterectomy and bilateral salpingo-oophorectomy)   Other Visit Diagnoses     Need for tetanus, diphtheria, and acellular pertussis (Tdap) vaccine    -  Primary   Relevant Orders   Tdap vaccine greater than or equal to 7yo IM (Completed)        Follow up plan: Return in about 1 year (around 10/17/2022) for cpe.   LABORATORY TESTING:  - Pap smear: not applicable, s/p TAH-BSO 05/2020.  IMMUNIZATIONS:   - Tdap: Tetanus vaccination status reviewed: Td vaccination indicated and given today. - Influenza: Postponed to flu season - Pneumovax: Not applicable - Prevnar: Not applicable - HPV: Not applicable - Shingrix vaccine: discussed, will check with insurance to determine coverage - COVID vaccine: has received 4 doses of the mRNA vaccine.   SCREENING: - Mammogram: Up to date  - Colonoscopy: Up to date  - Bone Density:  discussed,  plan to get next year with mammogram   - Lung Cancer Screening: Not applicable   Hep C Screening: UTD STD testing and prevention (HIV/chl/gon/syphilis): no concerns Sexual History : monogamous Menstrual History/LMP/Abnormal Bleeding: s/p TAH-BSP Incontinence Symptoms:  none  Osteoporosis: Discussed high calcium and vitamin D supplementation, weight bearing exercises  Advanced Care Planning: A voluntary discussion about advance care planning including the explanation and discussion of advance directives.  Discussed health care proxy and Living will, and the patient was able to identify a health care proxy as husband, Torie Towle.  Patient does not have a living will at present time. If patient does have living will, I have requested they bring this to the clinic to be scanned in to their chart.  PATIENT COUNSELING:   Advised to take 1 mg of folate supplement per day if capable of pregnancy.   Sexuality: Discussed sexually transmitted diseases, partner selection, use of condoms, avoidance of unintended pregnancy  and contraceptive alternatives.   Advised to avoid cigarette smoking.  I discussed with the patient that most people either abstain from alcohol or drink within safe limits (<=14/week and <=4 drinks/occasion for males, <=7/weeks and <= 3 drinks/occasion for females) and that the risk for alcohol disorders and other health effects rises proportionally with the number of drinks per week and how often a drinker exceeds daily limits.  Discussed cessation/primary prevention of drug use and availability of treatment for abuse.   Diet: Encouraged to adjust caloric intake to maintain  or achieve ideal body weight, to reduce intake of dietary saturated fat and total fat, to limit sodium intake by avoiding high sodium foods and not adding table salt, and to maintain adequate dietary potassium and calcium preferably from fresh fruits, vegetables, and low-fat dairy products.    Stressed the importance of regular exercise.  Injury prevention: Discussed safety belts, safety helmets, smoke detector, smoking near bedding or upholstery.   Dental health: Discussed importance of regular tooth brushing, flossing, and dental visits.    NEXT PREVENTATIVE PHYSICAL  DUE IN 1 YEAR. Return in about 1 year (around 10/17/2022) for cpe.

## 2021-10-16 NOTE — Assessment & Plan Note (Addendum)
No complications other than mildly elevated LDL and A1c, not yet in prediabetic range. Discussed lifestyle changes including diet and exercise.

## 2022-08-18 ENCOUNTER — Ambulatory Visit (INDEPENDENT_AMBULATORY_CARE_PROVIDER_SITE_OTHER): Payer: Self-pay | Admitting: Physician Assistant

## 2022-08-18 VITALS — BP 138/86 | HR 100 | Temp 97.5°F | Resp 16 | Wt 213.0 lb

## 2022-08-18 DIAGNOSIS — H6121 Impacted cerumen, right ear: Secondary | ICD-10-CM

## 2022-08-18 NOTE — Progress Notes (Signed)
[facility] [address] [place], [place] [address]   Office Visit Note  Patient Name: Jessica Woods Date of Birth [id number]  Medical Record number [id number]  Date of Service: 08/18/2022  Chief Complaint  Patient presents with   Ear Fullness     52 y/o F presents to the clinic for c/o R ear fullness. Denies pain. Has to ask others to repeat themselves when they talk to her. Denies n/v. No ear drainage. No recent water activities or air travel. No fever, chills, dizziness.       Current Medication:  No outpatient encounter medications on file as of 08/18/2022.   No facility-administered encounter medications on file as of 08/18/2022.      Medical History: Past Medical History:  Diagnosis Date   Depression    5 YRS AGO   Fibroid    Intramural and subserous leiomyoma of uterus    Menorrhagia with regular cycle      Vital Signs: BP 138/86 (BP Location: Left Arm, Patient Position: Sitting, Cuff Size: Normal)   Pulse 100   Temp (!) 97.5 F (36.4 C) (Tympanic)   Resp 16   Wt 213 lb (96.6 kg)   LMP 04/27/2020   SpO2 98%   BMI 40.25 kg/m    Review of Systems  Constitutional: Negative.   HENT:  Positive for ear pain (discomfort). Negative for congestion, ear discharge, sinus pressure, sinus pain and sore throat.   Respiratory: Negative.    Cardiovascular: Negative.   Neurological: Negative.     Physical Exam Constitutional:      Appearance: Normal appearance.  HENT:     Head: Normocephalic and atraumatic.     Right Ear: External ear normal.     Left Ear: Tympanic membrane, ear canal and external ear normal.     Ears:     Comments: R ear : Occluded with cerumen. Post ear lavage, TM appears normal . Eyes:     Extraocular Movements: Extraocular movements intact.  Cardiovascular:     Rate and Rhythm: Normal rate and regular rhythm.  Pulmonary:     Effort: Pulmonary effort is normal.     Breath sounds: Normal breath sounds.  Skin:    General:  Skin is warm and dry.  Neurological:     Mental Status: She is alert and oriented to person, place, and time.  Psychiatric:        Mood and Affect: Mood normal.        Behavior: Behavior normal.       Assessment/Plan:  1. Right ear impacted cerumen - EAR CERUMEN REMOVAL  Do not use Q-tips to clean ear Ourchase otc ear wax removal kits and use as per instructions. RTC prn General Counseling: Jessica Woods verbalizes understanding of the findings of todays visit and agrees with plan of treatment. I have discussed any further diagnostic evaluation that may be needed or ordered today. We also reviewed her medications today. she has been encouraged to call the office with any questions or concerns that should arise related to todays visit.    Time spent:30 Minutes    Gilberto Better, [place] Physician Assistant

## 2022-10-08 DIAGNOSIS — F330 Major depressive disorder, recurrent, mild: Secondary | ICD-10-CM | POA: Diagnosis not present

## 2022-10-14 NOTE — Progress Notes (Deleted)
Name: Jessica Woods   MRN: [id number]    DOB: 12-15-1970   Date:10/14/2022       Progress Note  Subjective  Chief Complaint  No chief complaint on file.   HPI  Patient presents for annual CPE.  Diet: *** Exercise: ***  Last Eye Exam: *** Last Dental Exam: ***  Flowsheet Row Office Visit from 10/16/2021 in [facility]  AUDIT-C Score 0      Depression: Phq 9 is  {Desc; negative/positive:13464}    10/16/2021    9:15 AM 07/04/2021    9:20 AM 03/07/2020    2:07 PM  Depression screen PHQ 2/9  Decreased Interest 1 0 1  Down, Depressed, Hopeless 1 0 1  PHQ - 2 Score 2 0 2  Altered sleeping 0 0 0  Tired, decreased energy 1 1 2   Change in appetite 0 0 0  Feeling bad or failure about yourself  0 0 1  Trouble concentrating 0 0 0  Moving slowly or fidgety/restless 0 0 0  Suicidal thoughts 0 0 0  PHQ-9 Score 3 1 5   Difficult doing work/chores Not difficult at all Not difficult at all Not difficult at all   Hypertension: BP Readings from Last 3 Encounters:  08/18/22 138/86  10/16/21 120/74  09/27/21 (!) 146/99   Obesity: Wt Readings from Last 3 Encounters:  08/18/22 213 lb (96.6 kg)  10/16/21 211 lb 12.8 oz (96.1 kg)  09/27/21 190 lb 6.2 oz (86.4 kg)   BMI Readings from Last 3 Encounters:  08/18/22 40.25 kg/m  10/16/21 40.02 kg/m  09/27/21 35.97 kg/m     Vaccines:   HPV:  Tdap:  Shingrix:  Pneumonia:  Flu:  COVID-19:   Hep C Screening:  STD testing and prevention (HIV/chl/gon/syphilis):  Intimate partner violence: {Desc; negative/positive:13464} screen  Sexual History : Menstrual History/LMP/Abnormal Bleeding:  Discussed importance of follow up if any post-menopausal bleeding: {Response; yes/no/na:63}  Incontinence Symptoms: {Desc; negative/positive:13464} for symptoms   Breast cancer:  - Last Mammogram: 07/24/2021 - BRCA gene screening:   Osteoporosis Prevention : Discussed high calcium and vitamin D supplementation,  weight bearing exercises Bone density :{Response; yes/no/na:63}   Cervical cancer screening:   Skin cancer: Discussed monitoring for atypical lesions  Colorectal cancer: ***   Lung cancer:  Low Dose CT Chest recommended if Age 24-80 years, 20 pack-year currently smoking OR have quit w/in 15years. Patient does not qualify for screen   ECG: ***  Advanced Care Planning: A voluntary discussion about advance care planning including the explanation and discussion of advance directives.  Discussed health care proxy and Living will, and the patient was able to identify a health care proxy as ***.  Patient {DOES_DOES WUJ:81191} have a living will and power of attorney of health care   Lipids: Lab Results  Component Value Date   CHOL 185 07/04/2021   CHOL 152 03/15/2020   Lab Results  Component Value Date   HDL 61 07/04/2021   HDL 63 03/15/2020   Lab Results  Component Value Date   LDLCALC 108 (H) 07/04/2021   LDLCALC 74 03/15/2020   Lab Results  Component Value Date   TRIG 69 07/04/2021   TRIG 81 03/15/2020   Lab Results  Component Value Date   CHOLHDL 3.0 07/04/2021   CHOLHDL 2.4 03/15/2020   No results found for: "LDLDIRECT"  Glucose: Glucose  Date Value Ref Range Status  03/15/2020 95 65 - 99 mg/dL Final   Glucose, Bld  Date Value  Ref Range Status  07/04/2021 93 65 - 99 mg/dL Final    Comment:    .            Fasting reference interval .     Patient Active Problem List   Diagnosis Date Noted   S/P TAH-BSO (total abdominal hysterectomy and bilateral salpingo-oophorectomy) 10/16/2021   Morbid obesity with BMI of 40.0-44.9, adult (HCC) 10/16/2021   Polyp of transverse colon     Past Surgical History:  Procedure Laterality Date   ABDOMINAL HYSTERECTOMY     COLONOSCOPY WITH PROPOFOL N/A 09/27/2021   Procedure: COLONOSCOPY WITH PROPOFOL;  Surgeon: Toney Reil, MD;  Location: [facility];  Service: Gastroenterology;  Laterality: N/A;   CYSTOSCOPY   05/29/2020   Procedure: CYSTOSCOPY;  Surgeon: Natale Milch, MD;  Location: [facility] ORS;  Service: Gynecology;;   ROBOTIC ASSISTED TOTAL HYSTERECTOMY WITH BILATERAL SALPINGO OOPHERECTOMY Bilateral 05/29/2020   Procedure: XI ROBOTIC ASSISTED TOTAL HYSTERECTOMY WITH BILATERAL SALPINGECTOMY;  Surgeon: Natale Milch, MD;  Location: [facility] OR[facility];  Service: Gynecology;  Laterality: Bilateral;    Family History  Problem Relation Age of Onset   Hypertension Mother    COPD Mother    Heart failure Mother    Hyperlipidemia Mother    Heart disease Father    Heart failure Father    Diabetes Maternal Grandmother     Social History   Socioeconomic History   Marital status: Married    Spouse name: Not on file   Number of children: 0   Years of education: Not on file   Highest education level: Not on file  Occupational History   Not on file  Tobacco Use   Smoking status: Never   Smokeless tobacco: Never  Vaping Use   Vaping Use: Never used  Substance and Sexual Activity   Alcohol use: Never   Drug use: Never   Sexual activity: Not Currently  Other Topics Concern   Not on file  Social History Narrative   Not on file   Social Determinants of Health   Financial Resource Strain: Low Risk  (10/16/2021)   Overall Financial Resource Strain (CARDIA)    Difficulty of Paying Living Expenses: Not hard at all  Food Insecurity: No Food Insecurity (10/16/2021)   Hunger Vital Sign    Worried About Running Out of Food in the Last Year: Never true    Ran Out of Food in the Last Year: Never true  Transportation Needs: No Transportation Needs (10/16/2021)   PRAPARE - Administrator, Civil Service (Medical): No    Lack of Transportation (Non-Medical): No  Physical Activity: Insufficiently Active (10/16/2021)   Exercise Vital Sign    Days of Exercise per Week: 1 day    Minutes of Exercise per Session: 10 min  Stress: Stress Concern Present (10/16/2021)   [employer] of  Occupational Health - Occupational Stress Questionnaire    Feeling of Stress : To some extent  Social Connections: Socially Integrated (10/16/2021)   Social Connection and Isolation Panel [NHANES]    Frequency of Communication with Friends and Family: More than three times a week    Frequency of Social Gatherings with Friends and Family: Twice a week    Attends Religious Services: 1 to 4 times per year    Active Member of [employer] or Organizations: Yes    Attends Banker Meetings: 1 to 4 times per year    Marital Status: Married  Catering manager Violence: Not At Risk (  10/16/2021)   Humiliation, Afraid, Rape, and Kick questionnaire    Fear of Current or Ex-Partner: No    Emotionally Abused: No    Physically Abused: No    Sexually Abused: No    No current outpatient medications on file.  Allergies  Allergen Reactions   Mushroom Extract Complex Nausea And Vomiting    Abdominal pain   Other Nausea And Vomiting    Honey - nausea and abdominal pain    Spinach Diarrhea    Abdominal pain     ROS  ***  Objective  There were no vitals filed for this visit.  There is no height or weight on file to calculate BMI.  Physical Exam ***  No results found for this or any previous visit (from the past 2160 hour(s)).   Fall Risk:    10/16/2021    9:11 AM 07/04/2021    9:20 AM  Fall Risk   Falls in the past year? 0 0  Number falls in past yr: 0 0  Injury with Fall? 0 0  Follow up Falls evaluation completed Falls evaluation completed   ***  Functional Status Survey:   ***  Assessment & Plan  There are no diagnoses linked to this encounter.  -USPSTF grade A and B recommendations reviewed with patient; age-appropriate recommendations, preventive care, screening tests, etc discussed and encouraged; healthy living encouraged; see AVS for patient education given to patient -Discussed importance of 150 minutes of physical activity weekly, eat two servings of fish  weekly, eat one serving of tree nuts ( cashews, pistachios, pecans, almonds.Marland Kitchen) every other day, eat 6 servings of fruit/vegetables daily and drink plenty of water and avoid sweet beverages.   -Reviewed Health Maintenance: {yes ZO:[id number]}

## 2022-10-20 ENCOUNTER — Encounter: Payer: BC Managed Care – PPO | Admitting: Nurse Practitioner

## 2022-10-22 DIAGNOSIS — F330 Major depressive disorder, recurrent, mild: Secondary | ICD-10-CM | POA: Diagnosis not present

## 2022-11-03 DIAGNOSIS — F330 Major depressive disorder, recurrent, mild: Secondary | ICD-10-CM | POA: Diagnosis not present

## 2022-12-03 DIAGNOSIS — F330 Major depressive disorder, recurrent, mild: Secondary | ICD-10-CM | POA: Diagnosis not present

## 2022-12-16 DIAGNOSIS — F330 Major depressive disorder, recurrent, mild: Secondary | ICD-10-CM | POA: Diagnosis not present

## 2022-12-31 DIAGNOSIS — H43812 Vitreous degeneration, left eye: Secondary | ICD-10-CM | POA: Diagnosis not present

## 2023-01-14 DIAGNOSIS — F330 Major depressive disorder, recurrent, mild: Secondary | ICD-10-CM | POA: Diagnosis not present

## 2023-02-11 DIAGNOSIS — F330 Major depressive disorder, recurrent, mild: Secondary | ICD-10-CM | POA: Diagnosis not present

## 2023-02-16 DIAGNOSIS — H43812 Vitreous degeneration, left eye: Secondary | ICD-10-CM | POA: Diagnosis not present

## 2023-02-26 DIAGNOSIS — F330 Major depressive disorder, recurrent, mild: Secondary | ICD-10-CM | POA: Diagnosis not present

## 2023-03-11 DIAGNOSIS — F330 Major depressive disorder, recurrent, mild: Secondary | ICD-10-CM | POA: Diagnosis not present

## 2023-03-30 DIAGNOSIS — F330 Major depressive disorder, recurrent, mild: Secondary | ICD-10-CM | POA: Diagnosis not present

## 2023-04-09 NOTE — Progress Notes (Signed)
Name: Jessica Woods   MRN: [id number]    DOB: 1970-06-02   Date:04/10/2023       Progress Note  Subjective  Chief Complaint  Chief Complaint  Patient presents with   Annual Exam    HPI  Patient presents for annual CPE.  Diet: tries to eat well balanced diet Exercise: walks once a week 30 min, recommend 150 min of physical activity weekly    Sleep: 8 hours Last dental exam:October 2024 Last eye exam: August 2024  Health Maintenance  Topic Date Due   COVID-19 Vaccine (5 - 2024-25 season) 04/26/2023*   Zoster (Shingles) Vaccine (1 of 2) 07/09/2023*   Flu Shot  07/27/2023*   Mammogram  07/25/2023   Colon Cancer Screening  09/28/2031   DTaP/Tdap/Td vaccine (2 - Td or Tdap) 10/17/2031   Hepatitis C Screening  Completed   HIV Screening  Completed   HPV Vaccine  Aged Out  *Topic was postponed. The date shown is not the original due date.    Flowsheet Row Office Visit from 04/10/2023 in [facility]  AUDIT-C Score 1       Depression: Phq 9 is  negative    04/10/2023    8:37 AM 10/16/2021    9:15 AM 07/04/2021    9:20 AM 03/07/2020    2:07 PM  Depression screen PHQ 2/9  Decreased Interest 1 1 0 1  Down, Depressed, Hopeless 0 1 0 1  PHQ - 2 Score 1 2 0 2  Altered sleeping 1 0 0 0  Tired, decreased energy 1 1 1 2   Change in appetite 0 0 0 0  Feeling bad or failure about yourself  0 0 0 1  Trouble concentrating 0 0 0 0  Moving slowly or fidgety/restless 0 0 0 0  Suicidal thoughts 0 0 0 0  PHQ-9 Score 3 3 1 5   Difficult doing work/chores Not difficult at all Not difficult at all Not difficult at all Not difficult at all   Hypertension: BP Readings from Last 3 Encounters:  04/10/23 126/72  08/18/22 138/86  10/16/21 120/74   Obesity: Wt Readings from Last 3 Encounters:  04/10/23 204 lb (92.5 kg)  08/18/22 213 lb (96.6 kg)  10/16/21 211 lb 12.8 oz (96.1 kg)   BMI Readings from Last 3 Encounters:  04/10/23 38.55 kg/m  08/18/22 40.25  kg/m  10/16/21 40.02 kg/m     Vaccines:  HPV: up to at age 55 , ask insurance if age between 39-45  Shingrix: 23-64 yo and ask insurance if covered when patient above 9 yo Pneumonia:  educated and discussed with patient. Flu:  educated and discussed with patient.  Hep C Screening: completed STD testing and prevention (HIV/chl/gon/syphilis): completed Intimate partner violence:none Sexual History : not currently Menstrual History/LMP/Abnormal Bleeding: hysterectomy Incontinence Symptoms: none  Breast cancer:  - Last Mammogram: 06/2022 - BRCA gene screening: none  Osteoporosis: Discussed high calcium and vitamin D supplementation, weight bearing exercises  Cervical cancer screening: hysterectomy   Skin cancer: Discussed monitoring for atypical lesions  Colorectal cancer: 09/27/2021   Lung cancer:   Low Dose CT Chest recommended if Age 26-80 years, 20 pack-year currently smoking OR have quit w/in 15years. Patient does not qualify.   ECG: none  Advanced Care Planning: A voluntary discussion about advance care planning including the explanation and discussion of advance directives.  Discussed health care proxy and Living will, and the patient was able to identify a health care proxy as husband.  Patient does not have a living will at present time. If patient does have living will, I have requested they bring this to the clinic to be scanned in to their chart.  Lipids: Lab Results  Component Value Date   CHOL 185 07/04/2021   CHOL 152 03/15/2020   Lab Results  Component Value Date   HDL 61 07/04/2021   HDL 63 03/15/2020   Lab Results  Component Value Date   LDLCALC 108 (H) 07/04/2021   LDLCALC 74 03/15/2020   Lab Results  Component Value Date   TRIG 69 07/04/2021   TRIG 81 03/15/2020   Lab Results  Component Value Date   CHOLHDL 3.0 07/04/2021   CHOLHDL 2.4 03/15/2020   No results found for: "LDLDIRECT"  Glucose: Glucose  Date Value Ref Range Status   03/15/2020 95 65 - 99 mg/dL Final   Glucose, Bld  Date Value Ref Range Status  07/04/2021 93 65 - 99 mg/dL Final    Comment:    .            Fasting reference interval .     Patient Active Problem List   Diagnosis Date Noted   S/P TAH-BSO (total abdominal hysterectomy and bilateral salpingo-oophorectomy) 10/16/2021   Morbid obesity with BMI of 40.0-44.9, adult (HCC) 10/16/2021   Polyp of transverse colon     Past Surgical History:  Procedure Laterality Date   ABDOMINAL HYSTERECTOMY     COLONOSCOPY WITH PROPOFOL N/A 09/27/2021   Procedure: COLONOSCOPY WITH PROPOFOL;  Surgeon: Toney Reil, MD;  Location: [facility];  Service: Gastroenterology;  Laterality: N/A;   CYSTOSCOPY  05/29/2020   Procedure: CYSTOSCOPY;  Surgeon: Natale Milch, MD;  Location: [facility] OR[facility];  Service: Gynecology;;   ROBOTIC ASSISTED TOTAL HYSTERECTOMY WITH BILATERAL SALPINGO OOPHERECTOMY Bilateral 05/29/2020   Procedure: XI ROBOTIC ASSISTED TOTAL HYSTERECTOMY WITH BILATERAL SALPINGECTOMY;  Surgeon: Natale Milch, MD;  Location: [facility] OR[facility];  Service: Gynecology;  Laterality: Bilateral;    Family History  Problem Relation Age of Onset   Hypertension Mother    COPD Mother    Heart failure Mother    Hyperlipidemia Mother    Heart disease Father    Heart failure Father    Diabetes Maternal Grandmother     Social History   Socioeconomic History   Marital status: Married    Spouse name: Not on file   Number of children: 0   Years of education: Not on file   Highest education level: Master's degree (e.g., MA, MS, MEng, MEd, MSW, MBA)  Occupational History   Not on file  Tobacco Use   Smoking status: Never   Smokeless tobacco: Never  Vaping Use   Vaping status: Never Used  Substance and Sexual Activity   Alcohol use: Never   Drug use: Never   Sexual activity: Not Currently  Other Topics Concern   Not on file  Social History Narrative   Not on file   Social Drivers of  Health   Financial Resource Strain: Low Risk  (04/08/2023)   Overall Financial Resource Strain (CARDIA)    Difficulty of Paying Living Expenses: Not very hard  Food Insecurity: Food Insecurity Present (04/08/2023)   Hunger Vital Sign    Worried About Running Out of Food in the Last Year: Sometimes true    Ran Out of Food in the Last Year: Never true  Transportation Needs: No Transportation Needs (04/08/2023)   PRAPARE - Administrator, Civil Service (Medical): No  Lack of Transportation (Non-Medical): No  Physical Activity: Insufficiently Active (04/08/2023)   Exercise Vital Sign    Days of Exercise per Week: 1 day    Minutes of Exercise per Session: 30 min  Stress: No Stress Concern Present (04/08/2023)   [employer] of Occupational Health - Occupational Stress Questionnaire    Feeling of Stress : Only a little  Social Connections: Moderately Integrated (04/08/2023)   Social Connection and Isolation Panel [NHANES]    Frequency of Communication with Friends and Family: Once a week    Frequency of Social Gatherings with Friends and Family: Once a week    Attends Religious Services: 1 to 4 times per year    Active Member of [employer] or Organizations: Yes    Attends Banker Meetings: More than 4 times per year    Marital Status: Married  Catering manager Violence: Not At Risk (10/16/2021)   Humiliation, Afraid, Rape, and Kick questionnaire    Fear of Current or Ex-Partner: No    Emotionally Abused: No    Physically Abused: No    Sexually Abused: No    No current outpatient medications on file.  Allergies  Allergen Reactions   Mushroom Extract Complex (Do Not Select) Nausea And Vomiting    Abdominal pain   Other Nausea And Vomiting    Honey - nausea and abdominal pain    Spinach Diarrhea    Abdominal pain     ROS  Constitutional: Negative for fever or weight change.  Respiratory: Negative for cough and shortness of breath.   Cardiovascular:  Negative for chest pain or palpitations.  Gastrointestinal: Negative for abdominal pain, no bowel changes.  Musculoskeletal: Negative for gait problem or joint swelling.  Skin: Negative for rash.  Neurological: Negative for dizziness or headache.  No other specific complaints in a complete review of systems (except as listed in HPI above).   Objective  Vitals:   04/10/23 0830  BP: 126/72  Pulse: 90  Resp: 14  Temp: 97.8 F (36.6 C)  TempSrc: Oral  SpO2: 97%  Weight: 204 lb (92.5 kg)  Height: 5\' 1"  (1.549 m)    Body mass index is 38.55 kg/m.  Physical Exam Constitutional: Patient appears well-developed and well-nourished. No distress.  HENT: Head: Normocephalic and atraumatic. Ears: B TMs ok, no erythema or effusion; Nose: Nose normal. Mouth/Throat: Oropharynx is clear and moist. No oropharyngeal exudate.  Eyes: Conjunctivae and EOM are normal. Pupils are equal, round, and reactive to light. No scleral icterus.  Neck: Normal range of motion. Neck supple. No JVD present. No thyromegaly present.  Cardiovascular: Normal rate, regular rhythm and normal heart sounds.  No murmur heard. No BLE edema. Pulmonary/Chest: Effort normal and breath sounds normal. No respiratory distress. Abdominal: Soft. Bowel sounds are normal, no distension. There is no tenderness. no masses Breast: no lumps or masses, no nipple discharge or rashes Musculoskeletal: Normal range of motion, no joint effusions. No gross deformities Neurological: he is alert and oriented to person, place, and time. No cranial nerve deficit. Coordination, balance, strength, speech and gait are normal.  Skin: Skin is warm and dry. No rash noted. No erythema.  Psychiatric: Patient has a normal mood and affect. behavior is normal. Judgment and thought content normal.   No results found for this or any previous visit (from the past 2160 hours).   Fall Risk:    04/10/2023    8:37 AM 10/16/2021    9:11 AM 07/04/2021    9:20 AM  Fall Risk   Falls in the past year? 0 0 0  Number falls in past yr:  0 0  Injury with Fall?  0 0  Risk for fall due to : No Fall Risks    Follow up Falls prevention discussed Falls evaluation completed Falls evaluation completed     Functional Status Survey: Is the patient deaf or have difficulty hearing?: No Does the patient have difficulty seeing, even when wearing glasses/contacts?: No Does the patient have difficulty concentrating, remembering, or making decisions?: No Does the patient have difficulty walking or climbing stairs?: No Does the patient have difficulty dressing or bathing?: No Does the patient have difficulty doing errands alone such as visiting a doctor's office or shopping?: No   Assessment & Plan  1. Annual physical exam (Primary)  - COMPLETE METABOLIC PANEL WITH GFR - Hemoglobin A1c - Lipid panel - MM 3D SCREENING MAMMOGRAM BILATERAL BREAST; Future  2. Hyperlipidemia, unspecified hyperlipidemia type  - Lipid panel  3. Prediabetes  - COMPLETE METABOLIC PANEL WITH GFR - Hemoglobin A1c  4. Encounter for screening mammogram for malignant neoplasm of breast  - MM 3D SCREENING MAMMOGRAM BILATERAL BREAST; Future   -USPSTF grade A and B recommendations reviewed with patient; age-appropriate recommendations, preventive care, screening tests, etc discussed and encouraged; healthy living encouraged; see AVS for patient education given to patient -Discussed importance of 150 minutes of physical activity weekly, eat two servings of fish weekly, eat one serving of tree nuts ( cashews, pistachios, pecans, almonds.Marland Kitchen) every other day, eat 6 servings of fruit/vegetables daily and drink plenty of water and avoid sweet beverages.   -Reviewed Health Maintenance: yes

## 2023-04-10 ENCOUNTER — Ambulatory Visit (INDEPENDENT_AMBULATORY_CARE_PROVIDER_SITE_OTHER): Payer: BC Managed Care – PPO | Admitting: Nurse Practitioner

## 2023-04-10 ENCOUNTER — Encounter: Payer: Self-pay | Admitting: Nurse Practitioner

## 2023-04-10 VITALS — BP 126/72 | HR 90 | Temp 97.8°F | Resp 14 | Ht 61.0 in | Wt 204.0 lb

## 2023-04-10 DIAGNOSIS — Z1231 Encounter for screening mammogram for malignant neoplasm of breast: Secondary | ICD-10-CM

## 2023-04-10 DIAGNOSIS — R7303 Prediabetes: Secondary | ICD-10-CM | POA: Diagnosis not present

## 2023-04-10 DIAGNOSIS — E785 Hyperlipidemia, unspecified: Secondary | ICD-10-CM

## 2023-04-10 DIAGNOSIS — Z0000 Encounter for general adult medical examination without abnormal findings: Secondary | ICD-10-CM | POA: Diagnosis not present

## 2023-04-11 LAB — LIPID PANEL
Cholesterol: 219 mg/dL — ABNORMAL HIGH (ref <200)
HDL: 63 mg/dL (ref >=50)
LDL Cholesterol (Calc): 136 mg/dL — ABNORMAL HIGH
Non-HDL Cholesterol (Calc): 156 mg/dL — ABNORMAL HIGH (ref <130)
Total CHOL/HDL Ratio: 3.5 (calc) (ref <5.0)
Triglycerides: 96 mg/dL (ref <150)

## 2023-04-11 LAB — COMPLETE METABOLIC PANEL WITH GFR
AG Ratio: 1.9 (calc) (ref 1.0–2.5)
ALT: 13 U/L (ref 6–29)
AST: 17 U/L (ref 10–35)
Albumin: 4.5 g/dL (ref 3.6–5.1)
Alkaline phosphatase (APISO): 70 U/L (ref 37–153)
BUN/Creatinine Ratio: 15 (calc) (ref 6–22)
BUN: 16 mg/dL (ref 7–25)
CO2: 32 mmol/L (ref 20–32)
Calcium: 10.2 mg/dL (ref 8.6–10.4)
Chloride: 103 mmol/L (ref 98–110)
Creat: 1.07 mg/dL — ABNORMAL HIGH (ref 0.50–1.03)
Globulin: 2.4 g/dL (ref 1.9–3.7)
Glucose, Bld: 90 mg/dL (ref 65–99)
Potassium: 4.6 mmol/L (ref 3.5–5.3)
Sodium: 141 mmol/L (ref 135–146)
Total Bilirubin: 0.6 mg/dL (ref 0.2–1.2)
Total Protein: 6.9 g/dL (ref 6.1–8.1)
eGFR: 63 mL/min/{1.73_m2} (ref >=60)

## 2023-04-11 LAB — HEMOGLOBIN A1C
Hgb A1c MFr Bld: 5.8 %{Hb} — ABNORMAL HIGH (ref <5.7)
Mean Plasma Glucose: 120 mg/dL
eAG (mmol/L): 6.6 mmol/L

## 2023-04-15 DIAGNOSIS — F330 Major depressive disorder, recurrent, mild: Secondary | ICD-10-CM | POA: Diagnosis not present

## 2023-04-28 DIAGNOSIS — F330 Major depressive disorder, recurrent, mild: Secondary | ICD-10-CM | POA: Diagnosis not present

## 2023-05-13 DIAGNOSIS — F330 Major depressive disorder, recurrent, mild: Secondary | ICD-10-CM | POA: Diagnosis not present

## 2023-05-27 DIAGNOSIS — F330 Major depressive disorder, recurrent, mild: Secondary | ICD-10-CM | POA: Diagnosis not present

## 2023-06-10 DIAGNOSIS — F330 Major depressive disorder, recurrent, mild: Secondary | ICD-10-CM | POA: Diagnosis not present

## 2023-06-24 DIAGNOSIS — F330 Major depressive disorder, recurrent, mild: Secondary | ICD-10-CM | POA: Diagnosis not present

## 2023-07-22 DIAGNOSIS — F330 Major depressive disorder, recurrent, mild: Secondary | ICD-10-CM | POA: Diagnosis not present

## 2023-08-05 DIAGNOSIS — F330 Major depressive disorder, recurrent, mild: Secondary | ICD-10-CM | POA: Diagnosis not present

## 2023-08-19 DIAGNOSIS — F330 Major depressive disorder, recurrent, mild: Secondary | ICD-10-CM | POA: Diagnosis not present

## 2023-10-14 DIAGNOSIS — F330 Major depressive disorder, recurrent, mild: Secondary | ICD-10-CM | POA: Diagnosis not present

## 2023-12-02 DIAGNOSIS — F330 Major depressive disorder, recurrent, mild: Secondary | ICD-10-CM | POA: Diagnosis not present

## 2023-12-29 DIAGNOSIS — F330 Major depressive disorder, recurrent, mild: Secondary | ICD-10-CM | POA: Diagnosis not present

## 2024-01-19 DIAGNOSIS — F330 Major depressive disorder, recurrent, mild: Secondary | ICD-10-CM | POA: Diagnosis not present

## 2024-01-20 IMAGING — MG MM DIGITAL SCREENING BILAT W/ TOMO AND CAD
8 series · 8 of 24 positions shown · non-contrast
Comparison: None.

CLINICAL DATA: Screening. Baseline exam.

EXAM:
DIGITAL SCREENING BILATERAL MAMMOGRAM WITH TOMOSYNTHESIS AND CAD
TECHNIQUE: Bilateral screening digital craniocaudal and mediolateral oblique
mammograms were obtained. Bilateral screening digital breast
tomosynthesis was performed. The images were evaluated with
computer-aided detection.

[R MLO synth-2D]
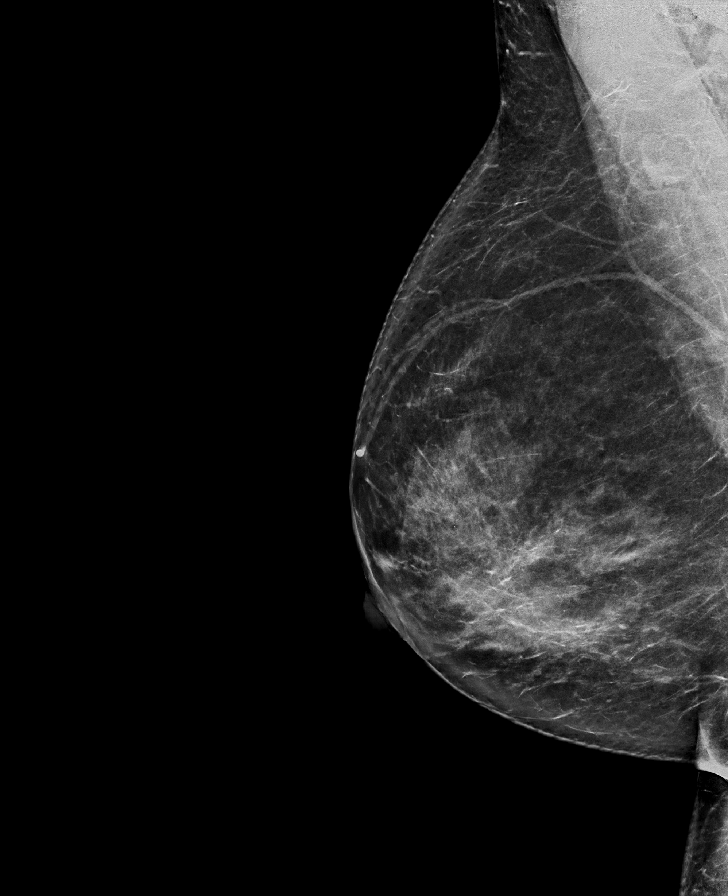

[R CC synth-2D]
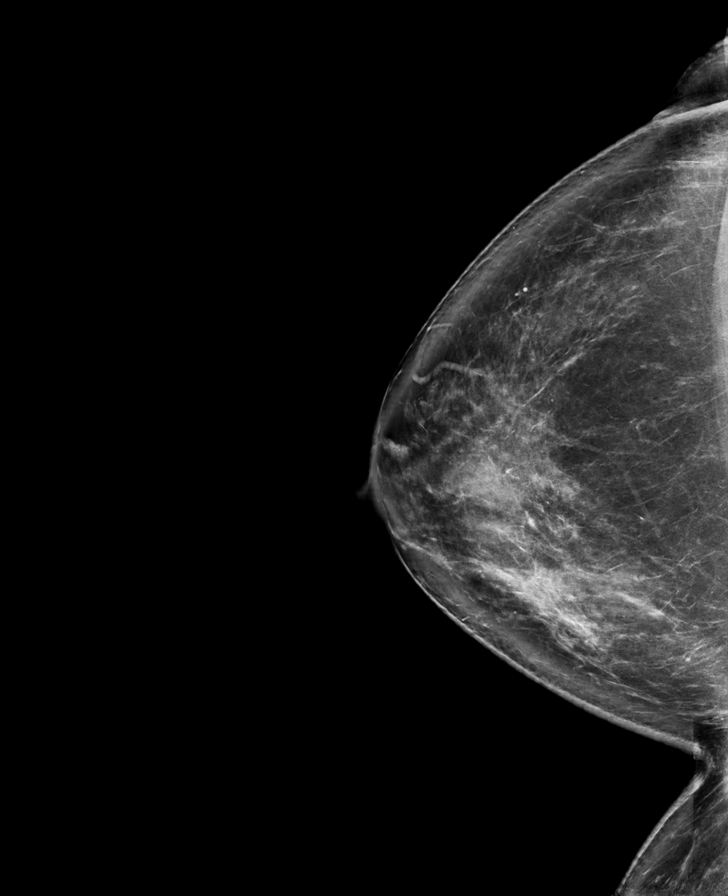

[L MLO synth-2D]
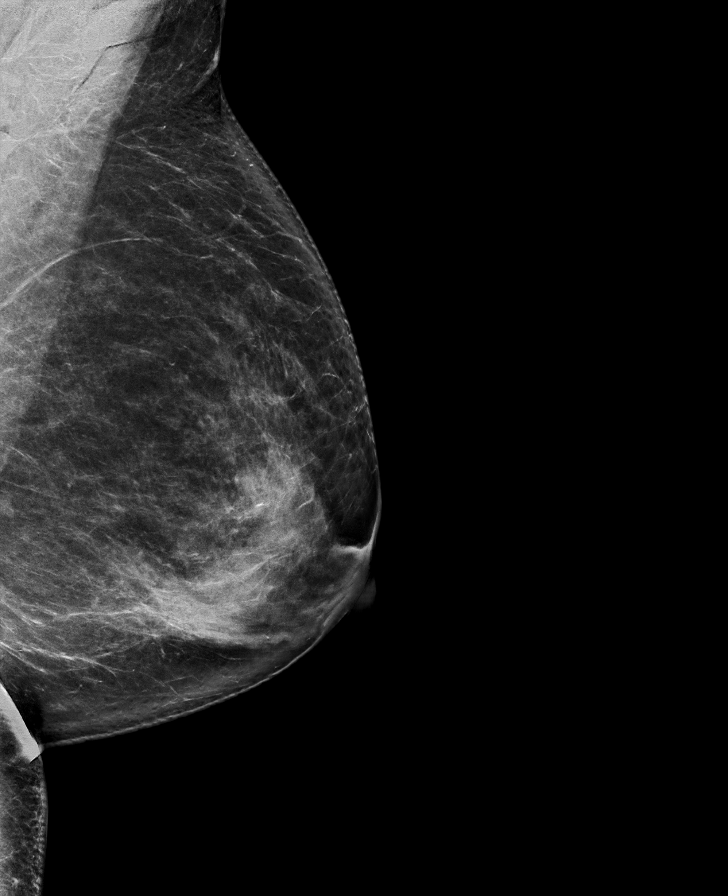

[L CC synth-2D]
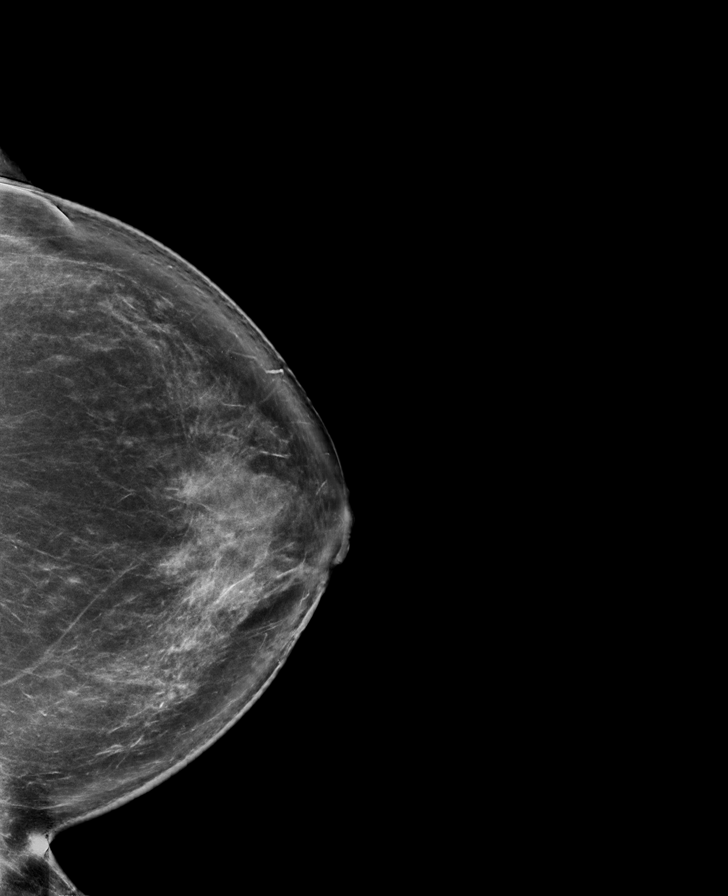

[R CC tomo · tomo slice 48/95.0]
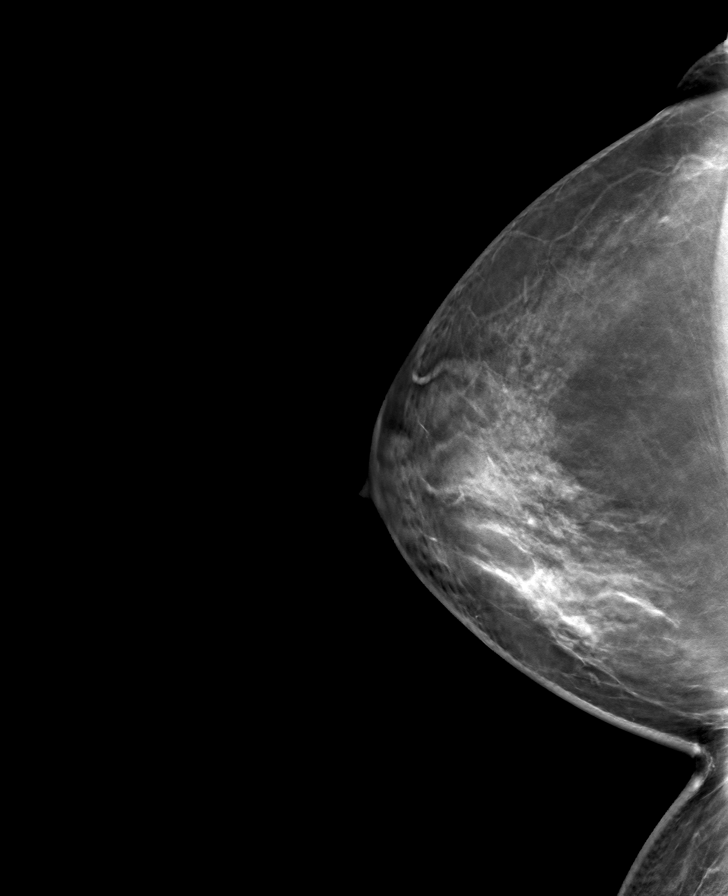

[R MLO tomo · tomo slice 44/87.0]
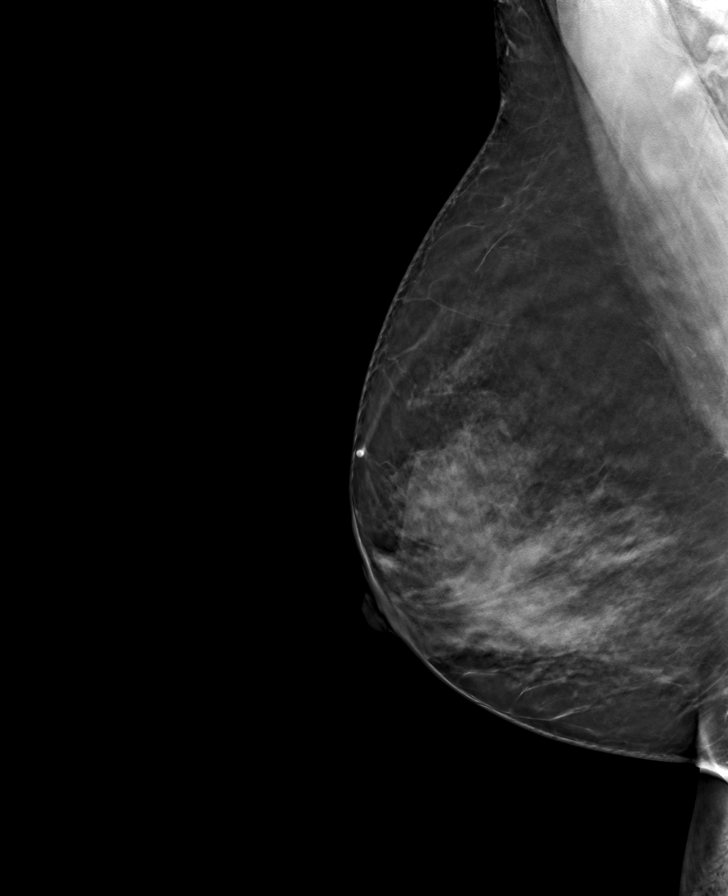

[L CC tomo · tomo slice 49/97.0]
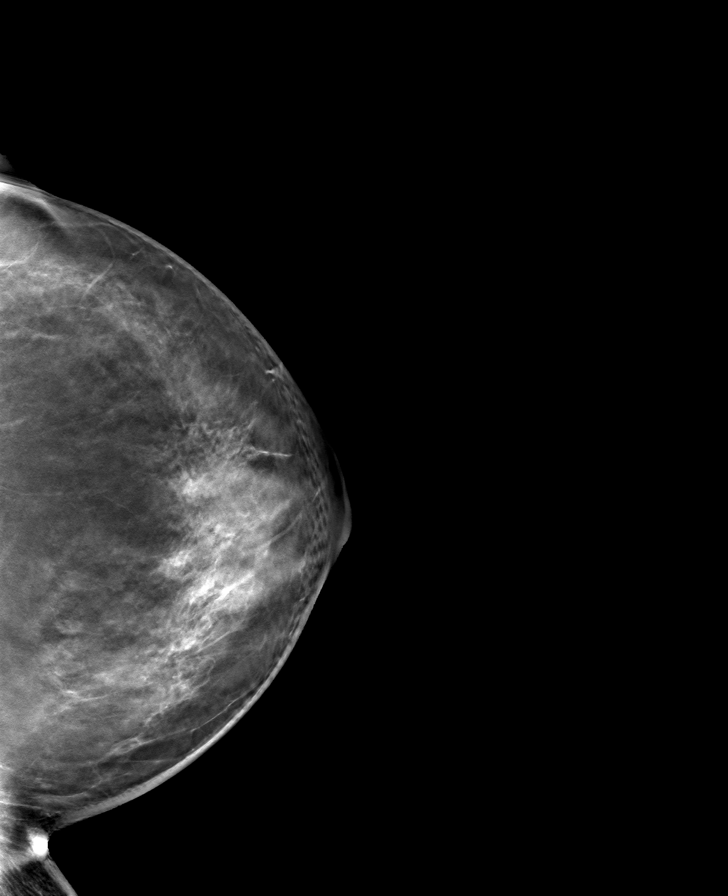

[L MLO tomo · tomo slice 44/87.0]
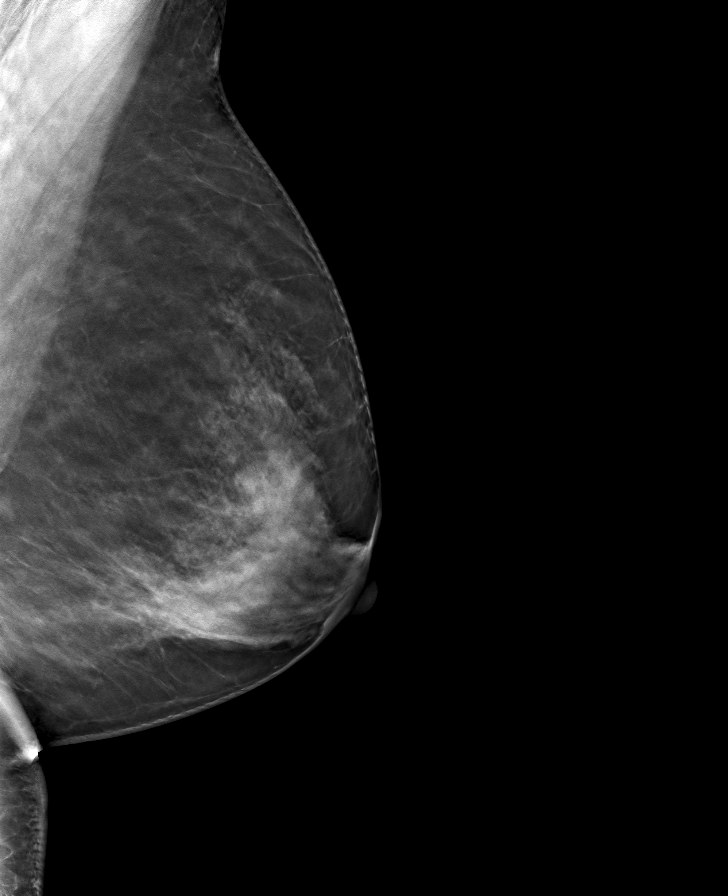

[8 of 24 positions shown; findings below may reference images not displayed]

ACR Breast Density Category b: There are scattered areas of
fibroglandular density.
FINDINGS: There are no findings suspicious for malignancy.
IMPRESSION: No mammographic evidence of malignancy. A result letter of this
screening mammogram will be mailed directly to the patient.

RECOMMENDATION:
Screening mammogram in one year. (Code:WD-6-7C9)

BI-RADS CATEGORY  1: Negative.

## 2024-02-16 DIAGNOSIS — F330 Major depressive disorder, recurrent, mild: Secondary | ICD-10-CM | POA: Diagnosis not present

## 2024-02-24 ENCOUNTER — Ambulatory Visit
Admission: RE | Admit: 2024-02-24 | Discharge: 2024-02-24 | Disposition: A | Discharge status: Home / Self Care | Source: Ambulatory Visit | Attending: Nurse Practitioner | Admitting: Nurse Practitioner

## 2024-02-24 DIAGNOSIS — Z0000 Encounter for general adult medical examination without abnormal findings: Secondary | ICD-10-CM

## 2024-02-24 DIAGNOSIS — Z1231 Encounter for screening mammogram for malignant neoplasm of breast: Secondary | ICD-10-CM

## 2024-03-15 DIAGNOSIS — F330 Major depressive disorder, recurrent, mild: Secondary | ICD-10-CM | POA: Diagnosis not present

## 2024-04-12 DIAGNOSIS — F330 Major depressive disorder, recurrent, mild: Secondary | ICD-10-CM | POA: Diagnosis not present

## 2024-04-15 ENCOUNTER — Encounter: Payer: Self-pay | Admitting: Nurse Practitioner

## 2024-04-15 ENCOUNTER — Ambulatory Visit: Payer: BC Managed Care – PPO | Admitting: Nurse Practitioner

## 2024-04-15 VITALS — BP 128/88 | HR 83 | Temp 98.0°F | Ht 61.0 in | Wt 202.0 lb

## 2024-04-15 DIAGNOSIS — Z90710 Acquired absence of both cervix and uterus: Secondary | ICD-10-CM

## 2024-04-15 DIAGNOSIS — R232 Flushing: Secondary | ICD-10-CM | POA: Diagnosis not present

## 2024-04-15 DIAGNOSIS — Z0000 Encounter for general adult medical examination without abnormal findings: Secondary | ICD-10-CM

## 2024-04-15 NOTE — Progress Notes (Signed)
 Name: Jessica Woods   MRN: [id number]    DOB: 04/28/1971   Date:04/15/2024       Progress Note  Subjective  Chief Complaint  Chief Complaint  Patient presents with   Annual Exam    Would like hormones checked. States she isn't having any issues.     HPI  Patient presents for annual CPE. Discussed the use of AI scribe software for clinical note transcription with the patient, who gave verbal consent to proceed.  History of Present Illness Jessica Woods is a 53 year old female who presents with a request to have her hormones checked.  Vasomotor symptoms - Hot flashes and night sweats occurred over the summer. - Symptoms improved with use of black cohosh. - Desires hormone level evaluation to better understand current hormonal status.  Fatigue and sleep disturbance - Feels tired, attributing fatigue to busy work schedule and limited exercise. - Walks for about twenty minutes once a week. - Maintains a well-balanced diet. - Sleeps seven to eight hours per night but consistently wakes up at four in the morning.  Genitourinary symptoms - Occasional stress incontinence with sneezing or coughing. - Not sexually active.  Mood and mental health - Depression screen is negative.    Diet: well balanced diet Exercise: walks 20 min once a week  Sleep: 7-8 hours Last dental exam:spring of this year Last eye exam: Jan 2025  Flowsheet Row Office Visit from 04/15/2024 in [facility]  AUDIT-C Score 1    Depression: Phq 9 is  negative    04/15/2024    9:03 AM 04/10/2023    8:37 AM 10/16/2021    9:15 AM 07/04/2021    9:20 AM 03/07/2020    2:07 PM  Depression screen PHQ 2/9  Decreased Interest 0 1 1 0 1  Down, Depressed, Hopeless 0 0 1 0 1  PHQ - 2 Score 0 1 2 0 2  Altered sleeping  1 0 0 0  Tired, decreased energy  1 1 1 2   Change in appetite  0 0 0 0  Feeling bad or failure about yourself   0 0 0 1  Trouble concentrating  0 0 0 0  Moving slowly  or fidgety/restless  0 0 0 0  Suicidal thoughts  0 0 0 0  PHQ-9 Score  3  3  1  5    Difficult doing work/chores  Not difficult at all Not difficult at all Not difficult at all Not difficult at all     Data saved with a previous flowsheet row definition   Hypertension: BP Readings from Last 3 Encounters:  04/15/24 128/88  04/10/23 126/72  08/18/22 138/86   Obesity: Wt Readings from Last 3 Encounters:  04/15/24 202 lb (91.6 kg)  04/10/23 204 lb (92.5 kg)  08/18/22 213 lb (96.6 kg)   BMI Readings from Last 3 Encounters:  04/15/24 38.17 kg/m  04/10/23 38.55 kg/m  08/18/22 40.25 kg/m     Vaccines:  HPV: up to at age 52 , ask insurance if age between 49-45  Shingrix: 33-64 yo and ask insurance if covered when patient above 52 yo Pneumonia:  educated and discussed with patient. Flu:  educated and discussed with patient.  Hep C Screening: completed STD testing and prevention (HIV/chl/gon/syphilis): completed Intimate partner violence:none Sexual History :not currently sexually active Menstrual History/LMP/Abnormal Bleeding: hysterectomy Incontinence Symptoms: stress incontinence  Breast cancer:  - Last Mammogram: 02/24/2024 - BRCA gene screening: none  Osteoporosis: Discussed high calcium  and vitamin D supplementation, weight bearing exercises  Cervical cancer screening: hysterectomy  Skin cancer: Discussed monitoring for atypical lesions  Colorectal cancer: 09/27/2021   Lung cancer:   Low Dose CT Chest recommended if Age 72-80 years, 20 pack-year currently smoking OR have quit w/in 15years. Patient does not qualify.   ECG: none  Advanced Care Planning: A voluntary discussion about advance care planning including the explanation and discussion of advance directives.  Discussed health care proxy and Living will, and the patient was able to identify a health care proxy as husband Mr. Jessica Woods.  Patient does not have a living will at present time. If patient does have living  will, I have requested they bring this to the clinic to be scanned in to their chart.  Lipids: Lab Results  Component Value Date   CHOL 219 (H) 04/10/2023   CHOL 185 07/04/2021   CHOL 152 03/15/2020   Lab Results  Component Value Date   HDL 63 04/10/2023   HDL 61 07/04/2021   HDL 63 03/15/2020   Lab Results  Component Value Date   LDLCALC 136 (H) 04/10/2023   LDLCALC 108 (H) 07/04/2021   LDLCALC 74 03/15/2020   Lab Results  Component Value Date   TRIG 96 04/10/2023   TRIG 69 07/04/2021   TRIG 81 03/15/2020   Lab Results  Component Value Date   CHOLHDL 3.5 04/10/2023   CHOLHDL 3.0 07/04/2021   CHOLHDL 2.4 03/15/2020   No results found for: LDLDIRECT  Glucose: Glucose  Date Value Ref Range Status  03/15/2020 95 65 - 99 mg/dL Final   Glucose, Bld  Date Value Ref Range Status  04/10/2023 90 65 - 99 mg/dL Final    Comment:    .            Fasting reference interval .   07/04/2021 93 65 - 99 mg/dL Final    Comment:    .            Fasting reference interval .     Patient Active Problem List   Diagnosis Date Noted   S/P TAH-BSO (total abdominal hysterectomy and bilateral salpingo-oophorectomy) 10/16/2021   Morbid obesity with BMI of 40.0-44.9, adult (HCC) 10/16/2021   Polyp of transverse colon     Past Surgical History:  Procedure Laterality Date   ABDOMINAL HYSTERECTOMY     COLONOSCOPY WITH PROPOFOL  N/A 09/27/2021   Procedure: COLONOSCOPY WITH PROPOFOL ;  Surgeon: Unk Corinn Skiff, MD;  Location: [facility];  Service: Gastroenterology;  Laterality: N/A;   CYSTOSCOPY  05/29/2020   Procedure: CYSTOSCOPY;  Surgeon: Victor Claudell SAUNDERS, MD;  Location: [facility];  Service: Gynecology;;   ROBOTIC ASSISTED TOTAL HYSTERECTOMY WITH BILATERAL SALPINGO OOPHERECTOMY Bilateral 05/29/2020   Procedure: XI ROBOTIC ASSISTED TOTAL HYSTERECTOMY WITH BILATERAL SALPINGECTOMY;  Surgeon: Victor Claudell SAUNDERS, MD;  Location: [facility] ORS;  Service: Gynecology;   Laterality: Bilateral;    Family History  Problem Relation Age of Onset   Hypertension Mother    COPD Mother    Heart failure Mother    Hyperlipidemia Mother    Heart disease Father    Heart failure Father    Diabetes Maternal Grandmother    Diabetes Maternal Grandfather    Heart disease Paternal Grandmother     Social History   Socioeconomic History   Marital status: Married    Spouse name: Not on file   Number of children: 0   Years of education: Not on file   Highest education level:  Master's degree (e.g., MA, MS, MEng, MEd, MSW, MBA)  Occupational History   Not on file  Tobacco Use   Smoking status: Never   Smokeless tobacco: Never  Vaping Use   Vaping status: Never Used  Substance and Sexual Activity   Alcohol use: Never   Drug use: Never   Sexual activity: Not Currently  Other Topics Concern   Not on file  Social History Narrative   Not on file   Social Drivers of Health   Tobacco Use: Low Risk (04/15/2024)   Patient History    Smoking Tobacco Use: Never    Smokeless Tobacco Use: Never    Passive Exposure: Not on file  Financial Resource Strain: Low Risk (04/14/2024)   Overall Financial Resource Strain (CARDIA)    Difficulty of Paying Living Expenses: Not very hard  Food Insecurity: No Food Insecurity (04/14/2024)   Epic    Worried About Radiation Protection Practitioner of Food in the Last Year: Never true    Ran Out of Food in the Last Year: Never true  Transportation Needs: No Transportation Needs (04/14/2024)   Epic    Lack of Transportation (Medical): No    Lack of Transportation (Non-Medical): No  Physical Activity: Insufficiently Active (04/14/2024)   Exercise Vital Sign    Days of Exercise per Week: 2 days    Minutes of Exercise per Session: 20 min  Stress: No Stress Concern Present (04/14/2024)   [employer] of Occupational Health - Occupational Stress Questionnaire    Feeling of Stress: Only a little  Social Connections: Moderately Integrated  (04/14/2024)   Social Connection and Isolation Panel    Frequency of Communication with Friends and Family: Once a week    Frequency of Social Gatherings with Friends and Family: Once a week    Attends Religious Services: More than 4 times per year    Active Member of [employer] or Organizations: Yes    Attends Banker Meetings: 1 to 4 times per year    Marital Status: Married  Catering Manager Violence: Not At Risk (04/15/2024)   Epic    Fear of Current or Ex-Partner: No    Emotionally Abused: No    Physically Abused: No    Sexually Abused: No  Depression (PHQ2-9): Low Risk (04/15/2024)   Depression (PHQ2-9)    PHQ-2 Score: 0  Alcohol Screen: Low Risk (04/14/2024)   Alcohol Screen    Last Alcohol Screening Score (AUDIT): 1  Housing: Low Risk (04/14/2024)   Epic    Unable to Pay for Housing in the Last Year: No    Number of Times Moved in the Last Year: 0    Homeless in the Last Year: No  Utilities: Not At Risk (04/15/2024)   Epic    Threatened with loss of utilities: No  Health Literacy: Adequate Health Literacy (04/15/2024)   B1300 Health Literacy    Frequency of need for help with medical instructions: Never    Current Medications[1]  Allergies[2]   ROS  Constitutional: Negative for fever or weight change.  Respiratory: Negative for cough and shortness of breath.   Cardiovascular: Negative for chest pain or palpitations.  Gastrointestinal: Negative for abdominal pain, no bowel changes.  Musculoskeletal: Negative for gait problem or joint swelling.  Skin: Negative for rash.  Neurological: Negative for dizziness or headache.  No other specific complaints in a complete review of systems (except as listed in HPI above).   Objective  Vitals:   04/15/24 0859 04/15/24 0937  BP: ROLLEN)  128/92 128/88  Pulse: 83   Temp: 98 F (36.7 C)   SpO2: 96%   Weight: 202 lb (91.6 kg)   Height: 5' 1 (1.549 m)     Body mass index is 38.17 kg/m.  Physical  Exam Vitals reviewed.  Constitutional:      Appearance: Normal appearance.  HENT:     Head: Normocephalic.     Right Ear: Tympanic membrane normal.     Left Ear: Tympanic membrane normal.     Nose: Nose normal.  Eyes:     Extraocular Movements: Extraocular movements intact.     Conjunctiva/sclera: Conjunctivae normal.     Pupils: Pupils are equal, round, and reactive to light.  Neck:     Thyroid: No thyroid mass, thyromegaly or thyroid tenderness.  Cardiovascular:     Rate and Rhythm: Normal rate and regular rhythm.     Pulses: Normal pulses.     Heart sounds: Normal heart sounds.  Pulmonary:     Effort: Pulmonary effort is normal.     Breath sounds: Normal breath sounds.  Abdominal:     General: Bowel sounds are normal.     Palpations: Abdomen is soft.  Musculoskeletal:        General: Normal range of motion.     Cervical back: Normal range of motion and neck supple.     Right lower leg: No edema.     Left lower leg: No edema.  Skin:    General: Skin is warm and dry.     Capillary Refill: Capillary refill takes less than 2 seconds.  Neurological:     General: No focal deficit present.     Mental Status: She is alert and oriented to person, place, and time. Mental status is at baseline.  Psychiatric:        Mood and Affect: Mood normal.        Behavior: Behavior normal.        Thought Content: Thought content normal.        Judgment: Judgment normal.     Fall Risk:    04/10/2023    8:37 AM 10/16/2021    9:11 AM 07/04/2021    9:20 AM  Fall Risk   Falls in the past year? 0 0 0  Number falls in past yr:  0 0  Injury with Fall?  0  0   Risk for fall due to : No Fall Risks    Follow up Falls prevention discussed Falls evaluation completed  Falls evaluation completed      Data saved with a previous flowsheet row definition     Functional Status Survey: Is the patient deaf or have difficulty hearing?: No Does the patient have difficulty seeing, even when wearing  glasses/contacts?: No Does the patient have difficulty concentrating, remembering, or making decisions?: No Does the patient have difficulty walking or climbing stairs?: No Does the patient have difficulty dressing or bathing?: No Does the patient have difficulty doing errands alone such as visiting a doctor's office or shopping?: No   Assessment & Plan  Problem List Items Addressed This Visit   None Visit Diagnoses       Annual physical exam    -  Primary   Relevant Orders   CBC with Differential/Platelet   Comprehensive metabolic panel with GFR   Hemoglobin A1c   Lipid panel     H/O total hysterectomy       Relevant Orders   FSH/LH   Estrogens, total  TSH     Hot flashes       Relevant Orders   FSH/LH   Estrogens, total   TSH      Assessment and Plan Assessment & Plan Woman's Wellness Visit Routine wellness visit with no significant concerns. Depression screen negative. Up to date on mammogram and colonoscopy. No smoking history. No issues with incontinence or sexual activity. No violence at home. No living will yet, but on the list of things to do. - Encouraged scheduling of dental exam - Discussed living will and encouraged completion  Hot flashes Experiencing hot flashes and sweats over the summer, managed with black cohosh. No current symptoms reported.    -USPSTF grade A and B recommendations reviewed with patient; age-appropriate recommendations, preventive care, screening tests, etc discussed and encouraged; healthy living encouraged; see AVS for patient education given to patient -Discussed importance of 150 minutes of physical activity weekly, eat two servings of fish weekly, eat one serving of tree nuts ( cashews, pistachios, pecans, almonds.SABRA) every other day, eat 6 servings of fruit/vegetables daily and drink plenty of water and avoid sweet beverages.   -Reviewed Health Maintenance: yes     [1] No current outpatient medications on file. [2]   Allergies Allergen Reactions   Mushroom Extract Complex (Obsolete) Nausea And Vomiting    Abdominal pain   Other Nausea And Vomiting    Honey - nausea and abdominal pain    Spinach Diarrhea    Abdominal pain

## 2024-04-18 ENCOUNTER — Ambulatory Visit: Payer: Self-pay | Admitting: Nurse Practitioner

## 2024-04-19 LAB — COMPREHENSIVE METABOLIC PANEL WITH GFR
AG Ratio: 1.8 (calc) (ref 1.0–2.5)
ALT: 16 U/L (ref 6–29)
AST: 20 U/L (ref 10–35)
Albumin: 4.4 g/dL (ref 3.6–5.1)
Alkaline phosphatase (APISO): 70 U/L (ref 37–153)
BUN/Creatinine Ratio: 17 (calc) (ref 6–22)
BUN: 18 mg/dL (ref 7–25)
CO2: 32 mmol/L (ref 20–32)
Calcium: 10.1 mg/dL (ref 8.6–10.4)
Chloride: 103 mmol/L (ref 98–110)
Creat: 1.09 mg/dL — ABNORMAL HIGH (ref 0.50–1.03)
Globulin: 2.4 g/dL (ref 1.9–3.7)
Glucose, Bld: 101 mg/dL — ABNORMAL HIGH (ref 65–99)
Potassium: 4.4 mmol/L (ref 3.5–5.3)
Sodium: 141 mmol/L (ref 135–146)
Total Bilirubin: 0.6 mg/dL (ref 0.2–1.2)
Total Protein: 6.8 g/dL (ref 6.1–8.1)
eGFR: 61 mL/min/1.73m2

## 2024-04-19 LAB — CBC WITH DIFFERENTIAL/PLATELET
Absolute Lymphocytes: 1235 {cells}/uL (ref 850–3900)
Absolute Monocytes: 273 {cells}/uL (ref 200–950)
Basophils Absolute: 21 {cells}/uL (ref 0–200)
Basophils Relative: 0.5 %
Eosinophils Absolute: 101 {cells}/uL (ref 15–500)
Eosinophils Relative: 2.4 %
HCT: 42.4 % (ref 35.9–46.0)
Hemoglobin: 13.7 g/dL (ref 11.7–15.5)
MCH: 29.7 pg (ref 27.0–33.0)
MCHC: 32.3 g/dL (ref 31.6–35.4)
MCV: 91.8 fL (ref 81.4–101.7)
MPV: 10.7 fL (ref 7.5–12.5)
Monocytes Relative: 6.5 %
Neutro Abs: 2570 {cells}/uL (ref 1500–7800)
Neutrophils Relative %: 61.2 %
Platelets: 227 Thousand/uL (ref 140–400)
RBC: 4.62 Million/uL (ref 3.80–5.10)
RDW: 12.5 % (ref 11.0–15.0)
Total Lymphocyte: 29.4 %
WBC: 4.2 Thousand/uL (ref 3.8–10.8)

## 2024-04-19 LAB — HEMOGLOBIN A1C
Hgb A1c MFr Bld: 5.6 %
Mean Plasma Glucose: 114 mg/dL
eAG (mmol/L): 6.3 mmol/L

## 2024-04-19 LAB — FSH/LH
FSH: 120 m[IU]/mL — ABNORMAL HIGH
LH: 50.1 m[IU]/mL

## 2024-04-19 LAB — TSH: TSH: 2.21 m[IU]/L

## 2024-04-19 LAB — LIPID PANEL
Cholesterol: 200 mg/dL — ABNORMAL HIGH
HDL: 58 mg/dL
LDL Cholesterol (Calc): 121 mg/dL — ABNORMAL HIGH
Non-HDL Cholesterol (Calc): 142 mg/dL — ABNORMAL HIGH
Total CHOL/HDL Ratio: 3.4 (calc)
Triglycerides: 100 mg/dL

## 2024-04-19 LAB — ESTROGENS, TOTAL: Estrogen: 101 pg/mL

## 2024-05-09 ENCOUNTER — Encounter: Payer: Self-pay | Admitting: Nurse Practitioner

## 2024-05-11 ENCOUNTER — Encounter: Payer: Self-pay | Admitting: Nurse Practitioner

## 2024-05-11 ENCOUNTER — Ambulatory Visit: Admitting: Nurse Practitioner

## 2024-05-11 VITALS — BP 126/88 | HR 82 | Temp 98.0°F | Ht 61.0 in | Wt 202.0 lb

## 2024-05-11 DIAGNOSIS — F331 Major depressive disorder, recurrent, moderate: Secondary | ICD-10-CM | POA: Diagnosis not present

## 2024-05-11 DIAGNOSIS — F419 Anxiety disorder, unspecified: Secondary | ICD-10-CM | POA: Diagnosis not present

## 2024-05-11 MED ORDER — SERTRALINE HCL 25 MG PO TABS: 25.0000 mg | ORAL_TABLET | Freq: Every day | ORAL | 0 refills | Status: AC

## 2024-05-11 NOTE — Progress Notes (Signed)
 "  BP 126/88   Pulse 82   Temp 98 F (36.7 C)   Ht 5' 1 (1.549 m)   Wt 202 lb (91.6 kg)   LMP 04/27/2020   SpO2 97%   BMI 38.17 kg/m    Subjective:    Patient ID: Jessica Woods, female    DOB: 05-08-1970, 54 y.o.   MRN: [id number]  HPI: Jessica Woods is a 54 y.o. female  Chief Complaint  Patient presents with   Depression   Discussed the use of AI scribe software for clinical note transcription with the patient, who gave verbal consent to proceed.  History of Present Illness Jessica Woods is a 54 year old female who presents with concerns regarding her previous use of Zoloft .  Depression/anxiety -patient reports that she is struggling with depression lately -would like to go back on zoloft  -denies any suicidal ideations -positive PHQ9 and GAD scores  Antidepressant discontinuation - Previously treated with Zoloft  25 mg with good response and tolerance - Discontinued Zoloft  after perceived improvement in life circumstances - Experienced difficulties during discontinuation  Psychological stress - Increased stress at work and home, worsened during the holiday season - Feels rushed and not getting adequate rest - Experiences a sense of being overwhelmed and 'crashing'         05/11/2024   10:40 AM 04/15/2024    9:03 AM 04/10/2023    8:37 AM  Depression screen PHQ 2/9  Decreased Interest 2 0 1  Down, Depressed, Hopeless 2 0 0  PHQ - 2 Score 4 0 1  Altered sleeping 1  1  Tired, decreased energy 3  1  Change in appetite 1  0  Feeling bad or failure about yourself  1  0  Trouble concentrating 1  0  Moving slowly or fidgety/restless 0  0  Suicidal thoughts 0  0  PHQ-9 Score 11  3   Difficult doing work/chores Somewhat difficult  Not difficult at all     Data saved with a previous flowsheet row definition       05/11/2024   10:40 AM 04/10/2023    8:39 AM 10/16/2021    9:16 AM 07/04/2021    9:21 AM  GAD 7 : Generalized Anxiety Score  Nervous, Anxious,  on Edge 1 0 0 0  Control/stop worrying 1 0 0 0  Worry too much - different things 1 0 0 0  Trouble relaxing 0 0 0 0  Restless 0 0 0 0  Easily annoyed or irritable 1 0 1 0  Afraid - awful might happen 0 0 0 0  Total GAD 7 Score 4 0 1 0  Anxiety Difficulty Not difficult at all  Not difficult at all Not difficult at all     Relevant past medical, surgical, family and social history reviewed and updated as indicated. Interim medical history since our last visit reviewed. Allergies and medications reviewed and updated.  Review of Systems  Constitutional: Negative for fever or weight change.  Respiratory: Negative for cough and shortness of breath.   Cardiovascular: Negative for chest pain or palpitations.  Gastrointestinal: Negative for abdominal pain, no bowel changes.  Musculoskeletal: Negative for gait problem or joint swelling.  Skin: Negative for rash.  Neurological: Negative for dizziness or headache.  No other specific complaints in a complete review of systems (except as listed in HPI above).      Objective:      BP 126/88   Pulse 82   Temp 98 F (  36.7 C)   Ht 5' 1 (1.549 m)   Wt 202 lb (91.6 kg)   LMP 04/27/2020   SpO2 97%   BMI 38.17 kg/m    Wt Readings from Last 3 Encounters:  05/11/24 202 lb (91.6 kg)  04/15/24 202 lb (91.6 kg)  04/10/23 204 lb (92.5 kg)    Physical Exam GENERAL: Alert, cooperative, well developed, no acute distress HEENT: Normocephalic, normal oropharynx, moist mucous membranes CHEST: Clear to auscultation bilaterally, No wheezes, rhonchi, or crackles CARDIOVASCULAR: Normal heart rate and rhythm, S1 and S2 normal without murmurs ABDOMEN: Soft, non-tender, non-distended, without organomegaly, Normal bowel sounds EXTREMITIES: No cyanosis or edema NEUROLOGICAL: Cranial nerves grossly intact, Moves all extremities without gross motor or sensory deficit  Results for orders placed or performed in visit on 04/15/24  FSH/LH   Collection Time:  04/15/24  9:44 AM  Result Value Ref Range   FSH 120.0 (H) mIU/mL   LH 50.1 mIU/mL  Estrogens , total   Collection Time: 04/15/24  9:44 AM  Result Value Ref Range   Estrogen 101 pg/mL  TSH   Collection Time: 04/15/24  9:44 AM  Result Value Ref Range   TSH 2.21 mIU/L  CBC with Differential/Platelet   Collection Time: 04/15/24  9:44 AM  Result Value Ref Range   WBC 4.2 3.8 - 10.8 Thousand/uL   RBC 4.62 3.80 - 5.10 Million/uL   Hemoglobin 13.7 11.7 - 15.5 g/dL   HCT 57.5 64.0 - 53.9 %   MCV 91.8 81.4 - 101.7 fL   MCH 29.7 27.0 - 33.0 pg   MCHC 32.3 31.6 - 35.4 g/dL   RDW 87.4 88.9 - 84.9 %   Platelets 227 140 - 400 Thousand/uL   MPV 10.7 7.5 - 12.5 fL   Neutro Abs 2,570 1,500 - 7,800 cells/uL   Absolute Lymphocytes 1,235 850 - 3,900 cells/uL   Absolute Monocytes 273 200 - 950 cells/uL   Eosinophils Absolute 101 15 - 500 cells/uL   Basophils Absolute 21 0 - 200 cells/uL   Neutrophils Relative % 61.2 %   Total Lymphocyte 29.4 %   Monocytes Relative 6.5 %   Eosinophils Relative 2.4 %   Basophils Relative 0.5 %  Comprehensive metabolic panel with GFR   Collection Time: 04/15/24  9:44 AM  Result Value Ref Range   Glucose, Bld 101 (H) 65 - 99 mg/dL   BUN 18 7 - 25 mg/dL   Creat 8.90 (H) 9.49 - 1.03 mg/dL   eGFR 61 > OR = 60 fO/fpw/8.26f7   BUN/Creatinine Ratio 17 6 - 22 (calc)   Sodium 141 135 - 146 mmol/L   Potassium 4.4 3.5 - 5.3 mmol/L   Chloride 103 98 - 110 mmol/L   CO2 32 20 - 32 mmol/L   Calcium 10.1 8.6 - 10.4 mg/dL   Total Protein 6.8 6.1 - 8.1 g/dL   Albumin 4.4 3.6 - 5.1 g/dL   Globulin 2.4 1.9 - 3.7 g/dL (calc)   AG Ratio 1.8 1.0 - 2.5 (calc)   Total Bilirubin 0.6 0.2 - 1.2 mg/dL   Alkaline phosphatase (APISO) 70 37 - 153 U/L   AST 20 10 - 35 U/L   ALT 16 6 - 29 U/L  Hemoglobin A1c   Collection Time: 04/15/24  9:44 AM  Result Value Ref Range   Hgb A1c MFr Bld 5.6 <5.7 %   Mean Plasma Glucose 114 mg/dL   eAG (mmol/L) 6.3 mmol/L  Lipid panel   Collection  Time: 04/15/24  9:44 AM  Result Value Ref Range   Cholesterol 200 (H) <200 mg/dL   HDL 58 > OR = 50 mg/dL   Triglycerides 899 <849 mg/dL   LDL Cholesterol (Calc) 121 (H) mg/dL (calc)   Total CHOL/HDL Ratio 3.4 <5.0 (calc)   Non-HDL Cholesterol (Calc) 142 (H) <130 mg/dL (calc)          Assessment & Plan:   Problem List Items Addressed This Visit   None Visit Diagnoses       Moderate episode of recurrent major depressive disorder (HCC)    -  Primary   Relevant Medications   sertraline  (ZOLOFT ) 25 MG tablet     Anxiety       Relevant Medications   sertraline  (ZOLOFT ) 25 MG tablet        Assessment and Plan Assessment & Plan Major depressive disorder, recurrent Recurrent major depressive disorder. Previously managed with Zoloft  25 mg, which was effective without side effects. Discontinued due to improved life circumstances. Recent increase in stress at work and home, leading to a depressive crash. Decision to restart Zoloft  25 mg based on previous positive response and current stressors. - Restarted Zoloft  25 mg daily. - Scheduled follow-up appointment in 4 weeks to assess response to medication and adjust dosage if necessary. - Offered virtual appointment option for follow-up. - Advised to contact via MyChart if experiencing significant distress or side effects.        Follow up plan: Return in about 4 weeks (around 06/08/2024) for follow up. "

## 2024-06-08 ENCOUNTER — Ambulatory Visit: Admitting: Nurse Practitioner
